# Patient Record
Sex: Female | Born: 1950
Health system: Southern US, Community
[De-identification: ages and names within clinical notes are randomized; demographics above are authoritative.]

## PROBLEM LIST (undated history)

## (undated) DIAGNOSIS — Z460 Encounter for fitting and adjustment of spectacles and contact lenses: Secondary | ICD-10-CM

## (undated) DIAGNOSIS — B029 Zoster without complications: Secondary | ICD-10-CM

## (undated) DIAGNOSIS — E785 Hyperlipidemia, unspecified: Secondary | ICD-10-CM

## (undated) DIAGNOSIS — M1990 Unspecified osteoarthritis, unspecified site: Secondary | ICD-10-CM

## (undated) DIAGNOSIS — M8580 Other specified disorders of bone density and structure, unspecified site: Secondary | ICD-10-CM

## (undated) DIAGNOSIS — M810 Age-related osteoporosis without current pathological fracture: Secondary | ICD-10-CM

## (undated) DIAGNOSIS — S22089A Unspecified fracture of T11-T12 vertebra, initial encounter for closed fracture: Secondary | ICD-10-CM

## (undated) DIAGNOSIS — J45909 Unspecified asthma, uncomplicated: Secondary | ICD-10-CM

## (undated) HISTORY — DX: Zoster without complications: B02.9

## (undated) HISTORY — DX: Unspecified fracture of t11-T12 vertebra, initial encounter for closed fracture: S22.089A

## (undated) HISTORY — DX: Age-related osteoporosis without current pathological fracture: M81.0

## (undated) HISTORY — PX: TUBAL LIGATION: SHX77

## (undated) HISTORY — DX: Other specified disorders of bone density and structure, unspecified site: M85.80

## (undated) HISTORY — PX: WISDOM TOOTH EXTRACTION: SHX21

## (undated) HISTORY — PX: COLONOSCOPY: SHX174

---
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
## 2002-05-18 ENCOUNTER — Ambulatory Visit (HOSPITAL_COMMUNITY): Admission: RE | Admit: 2002-05-18 | Discharge: 2002-05-18 | Payer: Self-pay | Admitting: Gynecology

## 2002-05-18 ENCOUNTER — Encounter: Payer: Self-pay | Admitting: Gynecology

## 2002-05-24 ENCOUNTER — Other Ambulatory Visit: Admission: RE | Admit: 2002-05-24 | Discharge: 2002-05-24 | Payer: Self-pay | Admitting: Gynecology

## 2002-09-08 ENCOUNTER — Emergency Department (HOSPITAL_COMMUNITY): Admission: EM | Admit: 2002-09-08 | Discharge: 2002-09-08 | Payer: Self-pay | Admitting: Emergency Medicine

## 2003-03-28 ENCOUNTER — Encounter: Admission: RE | Admit: 2003-03-28 | Discharge: 2003-03-28 | Payer: Self-pay | Admitting: Family Medicine

## 2003-03-28 ENCOUNTER — Encounter: Payer: Self-pay | Admitting: Family Medicine

## 2003-05-23 ENCOUNTER — Encounter: Payer: Self-pay | Admitting: Gynecology

## 2003-05-23 ENCOUNTER — Ambulatory Visit (HOSPITAL_COMMUNITY): Admission: RE | Admit: 2003-05-23 | Discharge: 2003-05-23 | Payer: Self-pay | Admitting: Gynecology

## 2003-06-20 ENCOUNTER — Other Ambulatory Visit: Admission: RE | Admit: 2003-06-20 | Discharge: 2003-06-20 | Payer: Self-pay | Admitting: Gynecology

## 2004-03-29 ENCOUNTER — Emergency Department (HOSPITAL_COMMUNITY): Admission: EM | Admit: 2004-03-29 | Discharge: 2004-03-29 | Payer: Self-pay | Admitting: Internal Medicine

## 2004-06-06 ENCOUNTER — Ambulatory Visit (HOSPITAL_COMMUNITY): Admission: RE | Admit: 2004-06-06 | Discharge: 2004-06-06 | Payer: Self-pay | Admitting: Gynecology

## 2004-07-15 ENCOUNTER — Other Ambulatory Visit: Admission: RE | Admit: 2004-07-15 | Discharge: 2004-07-15 | Payer: Self-pay | Admitting: Gynecology

## 2005-07-09 ENCOUNTER — Ambulatory Visit (HOSPITAL_COMMUNITY): Admission: RE | Admit: 2005-07-09 | Discharge: 2005-07-09 | Payer: Self-pay | Admitting: Gynecology

## 2005-07-17 ENCOUNTER — Other Ambulatory Visit: Admission: RE | Admit: 2005-07-17 | Discharge: 2005-07-17 | Payer: Self-pay | Admitting: Gynecology

## 2006-07-12 ENCOUNTER — Ambulatory Visit (HOSPITAL_COMMUNITY): Admission: RE | Admit: 2006-07-12 | Discharge: 2006-07-12 | Payer: Self-pay | Admitting: Gynecology

## 2006-10-06 ENCOUNTER — Other Ambulatory Visit: Admission: RE | Admit: 2006-10-06 | Discharge: 2006-10-06 | Payer: Self-pay | Admitting: Gynecology

## 2007-02-24 ENCOUNTER — Encounter: Admission: RE | Admit: 2007-02-24 | Discharge: 2007-02-24 | Payer: Self-pay | Admitting: Emergency Medicine

## 2007-04-18 ENCOUNTER — Ambulatory Visit (HOSPITAL_COMMUNITY): Admission: RE | Admit: 2007-04-18 | Discharge: 2007-04-18 | Payer: Self-pay | Admitting: Gastroenterology

## 2007-04-18 ENCOUNTER — Encounter (INDEPENDENT_AMBULATORY_CARE_PROVIDER_SITE_OTHER): Payer: Self-pay | Admitting: Gastroenterology

## 2007-05-22 ENCOUNTER — Emergency Department (HOSPITAL_COMMUNITY): Admission: EM | Admit: 2007-05-22 | Discharge: 2007-05-22 | Payer: Self-pay | Admitting: Emergency Medicine

## 2007-09-30 ENCOUNTER — Ambulatory Visit (HOSPITAL_COMMUNITY): Admission: RE | Admit: 2007-09-30 | Discharge: 2007-09-30 | Payer: Self-pay | Admitting: Gynecology

## 2009-03-11 ENCOUNTER — Ambulatory Visit: Payer: Self-pay | Admitting: Gastroenterology

## 2009-03-27 ENCOUNTER — Ambulatory Visit: Payer: Self-pay | Admitting: Gastroenterology

## 2009-04-25 ENCOUNTER — Ambulatory Visit: Payer: Self-pay | Admitting: Internal Medicine

## 2009-06-20 ENCOUNTER — Emergency Department (HOSPITAL_COMMUNITY): Admission: EM | Admit: 2009-06-20 | Discharge: 2009-06-20 | Payer: Self-pay | Admitting: Emergency Medicine

## 2010-05-13 ENCOUNTER — Ambulatory Visit (HOSPITAL_COMMUNITY): Admission: RE | Admit: 2010-05-13 | Discharge: 2010-05-13 | Payer: Self-pay | Admitting: Gynecology

## 2010-12-16 NOTE — Op Note (Signed)
Terri [name], [name]            ACCOUNT NO.:  [id number]   MEDICAL RECORD NO.:  [id number]          PATIENT TYPE:  AMB   LOCATION:  ENDO                         FACILITY:  [facility]   PHYSICIAN:  Anselmo Rod, M.D.  DATE OF BIRTH:  May 30, 1951   DATE OF PROCEDURE:  04/18/2007  DATE OF DISCHARGE:                               OPERATIVE REPORT   PROCEDURE PERFORMED:  Colonoscopy, with multiple random biopsies.   ENDOSCOPIST:  Anselmo Rod, M.D.   INSTRUMENT USED:  Pentax video colonoscope.   INDICATIONS FOR PROCEDURE:  A 60 year old white female undergoing a  screening colonoscopy. The patient has a history of ?diarrhea-  predominant IBS; rule out colonic polyps, masses. Random colon biopsies  planned to rule out collagenous versus lymphocytic colitis.   PREPROCEDURE PREPARATION:  Informed consent was procured from the  patient. The patient fasted for 8 hours prior to the procedure and was  prepped with 32 OsmoPrep pills the night prior to the procedure.  Risks  and benefits of the procedure, including a 10% miss rate of cancer and  polyps, were discussed with the patient as well.   PREPROCEDURE PHYSICAL EXAMINATION:  VITAL SIGNS:  The patient had stable  vital signs.  NECK:  Supple.  CHEST:  Clear to auscultation.  CARDIOVASCULAR:  S1, S2 regular.  ABDOMEN:  Soft, with normal bowel sounds.   DESCRIPTION OF THE PROCEDURE:  The patient was placed in the left  lateral decubitus position and sedated with 100 mcg of Fentanyl and 6 mg  of Versed given intravenously in slow incremental doses. Once the  patient was adequately sedated and maintained on low-flow oxygen and  continuous cardiac monitoring, the Pentax video colonoscope was advanced  from the rectum to the cecum. Multiple washes were done. The appendiceal  orifice and ileocecal valve were visualized.  The terminal ileum  appeared healthy and without lesions. There was patchy loss of vascular  markings, but the rest of  the colonic mucosa appeared healthy. No  masses, polyps, erosions, ulcerations, or diverticula were noted. Random  colon biopsies were done to rule out collagenous colitis versus  lymphocytic colitis.  Retroflexion in the rectum revealed no  abnormalities.  The patient tolerated the procedure well, without  immediate complications.   IMPRESSION:  1. Normal colonoscopy of the terminal ileum, except for patchy loss of      vascular markings. Biopsies done to rule out collagenous versus      lymphocytic colitis.  2. No masses, polyps, erosions, ulcerations, or diverticula noted.   RECOMMENDATIONS:  1. Await pathology results.  2. Avoid all nonsteroidals, including aspirin, for the next 2 weeks.  3. Repeat colonoscopy depending on pathology results.  4. Outpatient followup in the next 2 weeks for further      recommendations.      Anselmo Rod, M.D.  Electronically Signed     JNM/MEDQ  D:  04/18/2007  T:  04/18/2007  Job:  [id number]   cc:   Reuben Likes, M.D.  Fax: [phone]

## 2011-04-09 ENCOUNTER — Other Ambulatory Visit: Payer: Self-pay | Admitting: Gynecology

## 2011-04-09 DIAGNOSIS — Z1231 Encounter for screening mammogram for malignant neoplasm of breast: Secondary | ICD-10-CM

## 2011-05-15 ENCOUNTER — Ambulatory Visit (HOSPITAL_COMMUNITY)
Admission: RE | Admit: 2011-05-15 | Discharge: 2011-05-15 | Disposition: A | Discharge status: Home / Self Care | Payer: 59 | Source: Ambulatory Visit | Attending: Gynecology | Admitting: Gynecology

## 2011-05-15 DIAGNOSIS — Z1231 Encounter for screening mammogram for malignant neoplasm of breast: Secondary | ICD-10-CM | POA: Insufficient documentation

## 2012-05-03 ENCOUNTER — Other Ambulatory Visit: Payer: Self-pay | Admitting: Gynecology

## 2012-05-03 DIAGNOSIS — Z1231 Encounter for screening mammogram for malignant neoplasm of breast: Secondary | ICD-10-CM

## 2012-05-16 ENCOUNTER — Ambulatory Visit (HOSPITAL_COMMUNITY)
Admission: RE | Admit: 2012-05-16 | Discharge: 2012-05-16 | Disposition: A | Discharge status: Home / Self Care | Payer: 59 | Source: Ambulatory Visit | Attending: Gynecology | Admitting: Gynecology

## 2012-05-16 DIAGNOSIS — Z1231 Encounter for screening mammogram for malignant neoplasm of breast: Secondary | ICD-10-CM | POA: Insufficient documentation

## 2013-02-15 ENCOUNTER — Encounter: Payer: Self-pay | Admitting: Gynecology

## 2013-02-20 ENCOUNTER — Ambulatory Visit (INDEPENDENT_AMBULATORY_CARE_PROVIDER_SITE_OTHER): Payer: 59 | Admitting: Gynecology

## 2013-02-20 ENCOUNTER — Encounter: Payer: Self-pay | Admitting: Gynecology

## 2013-02-20 ENCOUNTER — Other Ambulatory Visit (HOSPITAL_COMMUNITY)
Admission: RE | Admit: 2013-02-20 | Discharge: 2013-02-20 | Disposition: A | Discharge status: Home / Self Care | Payer: 59 | Source: Ambulatory Visit | Attending: Gynecology | Admitting: Gynecology

## 2013-02-20 VITALS — BP 120/76 | Ht 62.0 in | Wt 151.0 lb

## 2013-02-20 DIAGNOSIS — N951 Menopausal and female climacteric states: Secondary | ICD-10-CM

## 2013-02-20 DIAGNOSIS — N816 Rectocele: Secondary | ICD-10-CM

## 2013-02-20 DIAGNOSIS — Z01419 Encounter for gynecological examination (general) (routine) without abnormal findings: Secondary | ICD-10-CM | POA: Insufficient documentation

## 2013-02-20 DIAGNOSIS — Z1151 Encounter for screening for human papillomavirus (HPV): Secondary | ICD-10-CM | POA: Insufficient documentation

## 2013-02-20 DIAGNOSIS — Z13220 Encounter for screening for lipoid disorders: Secondary | ICD-10-CM

## 2013-02-20 DIAGNOSIS — M8580 Other specified disorders of bone density and structure, unspecified site: Secondary | ICD-10-CM

## 2013-02-20 DIAGNOSIS — M899 Disorder of bone, unspecified: Secondary | ICD-10-CM

## 2013-02-20 NOTE — Progress Notes (Signed)
Terri [name] 26-May-1951 [id number]        62 y.o.  [id number] for annual exam.  Has not been in the office since 2008. Several issues noted below.  Past medical history,surgical history, medications, allergies, family history and social history were all reviewed and documented in the EPIC chart.  ROS:  Performed and pertinent positives and negatives are included in the history, assessment and plan .  Exam: Kim assistant Filed Vitals:   02/20/13 1507  BP: 120/76  Height: 5\' 2"  (1.575 m)  Weight: 151 lb (68.493 kg)   General appearance  Normal Skin grossly normal Head/Neck normal with no cervical or supraclavicular adenopathy thyroid normal Lungs  clear Cardiac RR, without RMG Abdominal  soft, nontender, without masses, organomegaly or hernia Breasts  examined lying and sitting without masses, retractions, discharge or axillary adenopathy. Pelvic  Ext/BUS/vagina  atrophic changes with a first to second-degree rectocele.  Cervix  normal with atrophic changes  Uterus  anteverted, normal size, shape and contour, midline and mobile nontender   Adnexa  Without masses or tenderness    Anus and perineum  normal   Rectovaginal  normal sphincter tone without palpated masses or tenderness.    Assessment/Plan:  62 y.o. [id number] female for annual exam.   1. Menopausal symptoms. Patient previously was treated with [facility] for menopausal symptoms for several years with good response. Ultimately stopped them in 2007/8. Has done well until recently when she is now had a recurrence of her hot flushes and night sweats. No weight changes hair or skin changes. Recently lost her mother and best friend. I discussed with her as although unusual to resume menopausal symptoms after they seem to have resolved. Whether the emotional stress as exacerbated this. Will check TSH now to make sure not thyroid related. Options for management include observation, OTC soy based, pharmacologic nonhormonal such as Effexor  or and HRT reviewed.  I reviewed the whole issue of HRT with her to include the WHI study with increased risk of stroke, heart attack, DVT and breast cancer. Transdermal versus oral first-pass effect benefit discussed. At this point patient wants to try OTC soy. She'll follow up with me if this does not seem effective and she wants to further discuss alternatives. 2. Rectocele. Patient has first to second-degree rectocele. Does have some pressure symptoms. This has remained stable for years. Options to include observation, pessary versus surgery such as posterior colporrhaphy discussed. She otherwise is well supported without evidence of cystocele or uterine prolapse. Patient prefers observation at this time. 3. Osteopenia. DEXA 2008 T score -2.2 no fracture due to HRT. There was a statistically significant decline of the left hip of 7.9% but improvement in the spine statistically significant. Options for treatment versus observation at that point were reviewed and she elected for observation unfortunately has not returned until now. We'll go ahead and recheck a DEXA now. We'll also check baseline vitamin D level. Increase calcium vitamin D discussed. We'll rediscuss options after DEXA report. 4. Pap smear 2008. Repeat Pap smear/HPV today. 5. Mammography 05/2012. Repeat mammogram this coming fall. SBE monthly reviewed. 6. Colonoscopy 2009. Recommended repeat a 10 year interval per her history. 7. Health maintenance. Baseline CBC comprehensive metabolic panel lipid profile TSH vitamin D and urinalysis ordered. Followup after lab work and DEXA.   Note: This document was prepared with digital dictation and possible smart phrase technology. Any transcriptional errors that result from this process are unintentional.   Dara Lords MD, 3:36 PM 02/20/2013

## 2013-02-20 NOTE — Patient Instructions (Signed)
Followup for bone density as scheduled. Try over-the-counter soy-based product. If hot flashes/night sweats continue unacceptable in followup for further treatment discussion.

## 2013-02-21 ENCOUNTER — Encounter: Payer: Self-pay | Admitting: Gynecology

## 2013-02-21 ENCOUNTER — Other Ambulatory Visit: Payer: Self-pay | Admitting: Gynecology

## 2013-02-21 DIAGNOSIS — E7800 Pure hypercholesterolemia, unspecified: Secondary | ICD-10-CM

## 2013-02-21 DIAGNOSIS — E559 Vitamin D deficiency, unspecified: Secondary | ICD-10-CM

## 2013-02-21 LAB — CBC WITH DIFFERENTIAL/PLATELET
Basophils Absolute: 0 10*3/uL (ref 0.0–0.1)
HCT: 36.1 % (ref 36.0–46.0)
Lymphocytes Relative: 39 % (ref 12–46)
Monocytes Absolute: 0.7 10*3/uL (ref 0.1–1.0)
Neutro Abs: 4 10*3/uL (ref 1.7–7.7)
RBC: 4.14 MIL/uL (ref 3.87–5.11)
RDW: 14.8 % (ref 11.5–15.5)
WBC: 8.1 10*3/uL (ref 4.0–10.5)

## 2013-02-21 LAB — VITAMIN D 25 HYDROXY (VIT D DEFICIENCY, FRACTURES): Vit D, 25-Hydroxy: 21 ng/mL — ABNORMAL LOW (ref 30–89)

## 2013-02-21 LAB — COMPREHENSIVE METABOLIC PANEL
ALT: 15 U/L (ref 0–35)
AST: 14 U/L (ref 0–37)
Albumin: 4.3 g/dL (ref 3.5–5.2)
BUN: 11 mg/dL (ref 6–23)
Calcium: 9.6 mg/dL (ref 8.4–10.5)
Chloride: 104 mEq/L (ref 96–112)
Potassium: 4.1 mEq/L (ref 3.5–5.3)

## 2013-02-21 LAB — URINALYSIS W MICROSCOPIC + REFLEX CULTURE
Casts: NONE SEEN
Crystals: NONE SEEN
Leukocytes, UA: NEGATIVE
Nitrite: NEGATIVE
Specific Gravity, Urine: 1.012 (ref 1.005–1.030)
pH: 7 (ref 5.0–8.0)

## 2013-04-03 DIAGNOSIS — M8580 Other specified disorders of bone density and structure, unspecified site: Secondary | ICD-10-CM

## 2013-04-03 HISTORY — DX: Other specified disorders of bone density and structure, unspecified site: M85.80

## 2013-04-18 ENCOUNTER — Ambulatory Visit (INDEPENDENT_AMBULATORY_CARE_PROVIDER_SITE_OTHER): Payer: 59

## 2013-04-18 ENCOUNTER — Encounter: Payer: Self-pay | Admitting: Gynecology

## 2013-04-18 DIAGNOSIS — M899 Disorder of bone, unspecified: Secondary | ICD-10-CM

## 2013-04-18 DIAGNOSIS — M8580 Other specified disorders of bone density and structure, unspecified site: Secondary | ICD-10-CM

## 2013-04-27 ENCOUNTER — Other Ambulatory Visit: Payer: 59

## 2013-05-25 ENCOUNTER — Other Ambulatory Visit: Payer: 59

## 2013-09-06 ENCOUNTER — Other Ambulatory Visit: Payer: Self-pay | Admitting: Gynecology

## 2013-09-06 DIAGNOSIS — Z1231 Encounter for screening mammogram for malignant neoplasm of breast: Secondary | ICD-10-CM

## 2013-09-07 ENCOUNTER — Ambulatory Visit (HOSPITAL_COMMUNITY)
Admission: RE | Admit: 2013-09-07 | Discharge: 2013-09-07 | Disposition: A | Discharge status: Home / Self Care | Payer: 59 | Source: Ambulatory Visit | Attending: Gynecology | Admitting: Gynecology

## 2013-09-07 DIAGNOSIS — Z1231 Encounter for screening mammogram for malignant neoplasm of breast: Secondary | ICD-10-CM | POA: Insufficient documentation

## 2013-09-13 ENCOUNTER — Encounter (HOSPITAL_BASED_OUTPATIENT_CLINIC_OR_DEPARTMENT_OTHER): Payer: Self-pay | Admitting: *Deleted

## 2013-09-13 NOTE — Progress Notes (Signed)
No labs needed

## 2013-09-14 ENCOUNTER — Other Ambulatory Visit: Payer: Self-pay | Admitting: Orthopaedic Surgery

## 2013-09-15 NOTE — H&P (Signed)
Terri [name] is an 63 y.o. female.   Chief Complaint: Right foot pain HPI: Terri [name] is complaining of right foot pain multiple months duration.  She is having pain with any type of footwear pain with ambulation.  She is walking with an altered gait.  She describes a pain as being intermittent and severe.  He must wear wide shoes.  X-rays: 3 views right foot show a dislocation of the second MTP joint.  Moderate bunion and intermetatarsal angle of 13 of the first ray.  We have discussed surgical options to reduce foot pain and improve function.  Past Medical History  Diagnosis Date  . Shingles   . Osteopenia 04/2013    t score -2.1FRAX 19%/1.4%  . Asthma     hx no meds  . Contact lens/glasses fitting     wears contacts or glasses    Past Surgical History  Procedure Laterality Date  . Tubal ligation    . Wisdom tooth extraction    . Colonoscopy      Family History  Problem Relation Age of Onset  . Heart disease Mother   . Lung cancer Mother   . Hypertension Father   . Stroke Father   . Breast cancer Maternal Aunt 52  . Breast cancer Paternal Aunt 52  . Lung cancer Maternal Grandfather   . Aneurysm Paternal Grandmother   . Lung cancer Paternal Grandfather    Social History:  reports that she has never smoked. She does not have any smokeless tobacco history on file. She reports that she drinks alcohol. She reports that she does not use illicit drugs.  Allergies:  Allergies  Allergen Reactions  . Aspirin Swelling  . Penicillins Other (See Comments)    Numbness    No prescriptions prior to admission    No results found for this or any previous visit (from the past 48 hour(s)). No results found.  Review of Systems  Musculoskeletal: Positive for joint pain.  All other systems reviewed and are negative.    Height 5\' 2"  (1.575 m), weight 68.04 kg (150 lb). Physical Exam  Constitutional: She appears well-developed.  HENT:  Head: Normocephalic.  Eyes: Pupils are  equal, round, and reactive to light.  Neck: Normal range of motion.  Cardiovascular: Normal rate.   Respiratory: Effort normal.  GI: Soft.  Musculoskeletal:  Right foot exam: Prominence of the second toe consistent with chronic dislocation at the MTP joint.  Sensation is intact.  Moderate bunion as well.  Good capillary refill.  Good sensory motor function.  Neurological: She is alert.  Skin: Skin is warm.  Psychiatric: She has a normal mood and affect.     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 Plan: We have discussed with Terri [name] a chevron osteotomy and open reduction of the MTP joint and pinning of the second toe.  Hopefully, this will reduce her foot pain and improve her function.  If reviewed the risks of anesthesia, infection.  She will need to be in a postoperative shoe for a number of weeks.  Sharilynn Cassity R 09/15/2013, 12:29 PM

## 2013-09-19 ENCOUNTER — Ambulatory Visit (HOSPITAL_BASED_OUTPATIENT_CLINIC_OR_DEPARTMENT_OTHER): Payer: 59 | Admitting: *Deleted

## 2013-09-19 ENCOUNTER — Ambulatory Visit (HOSPITAL_BASED_OUTPATIENT_CLINIC_OR_DEPARTMENT_OTHER)
Admission: RE | Admit: 2013-09-19 | Discharge: 2013-09-19 | Disposition: A | Discharge status: Home / Self Care | Payer: 59 | Source: Ambulatory Visit | Attending: Orthopaedic Surgery | Admitting: Orthopaedic Surgery

## 2013-09-19 ENCOUNTER — Encounter (HOSPITAL_BASED_OUTPATIENT_CLINIC_OR_DEPARTMENT_OTHER)
Admission: RE | Disposition: A | Discharge status: Home / Self Care | Payer: Self-pay | Source: Ambulatory Visit | Attending: Orthopaedic Surgery

## 2013-09-19 ENCOUNTER — Encounter (HOSPITAL_BASED_OUTPATIENT_CLINIC_OR_DEPARTMENT_OTHER): Payer: 59 | Admitting: *Deleted

## 2013-09-19 ENCOUNTER — Encounter (HOSPITAL_BASED_OUTPATIENT_CLINIC_OR_DEPARTMENT_OTHER): Payer: Self-pay | Admitting: *Deleted

## 2013-09-19 DIAGNOSIS — M2010 Hallux valgus (acquired), unspecified foot: Secondary | ICD-10-CM | POA: Insufficient documentation

## 2013-09-19 DIAGNOSIS — Z880 Allergy status to penicillin: Secondary | ICD-10-CM | POA: Insufficient documentation

## 2013-09-19 DIAGNOSIS — M21611 Bunion of right foot: Secondary | ICD-10-CM

## 2013-09-19 DIAGNOSIS — M24473 Recurrent dislocation, unspecified ankle: Secondary | ICD-10-CM | POA: Insufficient documentation

## 2013-09-19 DIAGNOSIS — M21619 Bunion of unspecified foot: Secondary | ICD-10-CM | POA: Insufficient documentation

## 2013-09-19 DIAGNOSIS — M2040 Other hammer toe(s) (acquired), unspecified foot: Secondary | ICD-10-CM | POA: Insufficient documentation

## 2013-09-19 DIAGNOSIS — M24476 Recurrent dislocation, unspecified foot: Secondary | ICD-10-CM

## 2013-09-19 DIAGNOSIS — Z886 Allergy status to analgesic agent status: Secondary | ICD-10-CM | POA: Insufficient documentation

## 2013-09-19 DIAGNOSIS — M2011 Hallux valgus (acquired), right foot: Secondary | ICD-10-CM

## 2013-09-19 DIAGNOSIS — R269 Unspecified abnormalities of gait and mobility: Secondary | ICD-10-CM | POA: Insufficient documentation

## 2013-09-19 HISTORY — PX: METATARSAL OSTEOTOMY WITH BUNIONECTOMY: SHX5662

## 2013-09-19 HISTORY — DX: Unspecified asthma, uncomplicated: J45.909

## 2013-09-19 HISTORY — DX: Encounter for fitting and adjustment of spectacles and contact lenses: Z46.0

## 2013-09-19 SURGERY — BUNIONECTOMY, WITH METATARSAL OSTEOTOMY
Anesthesia: Regional | Site: Foot | Laterality: Right

## 2013-09-19 MED ORDER — METOCLOPRAMIDE HCL 5 MG/ML IJ SOLN
INTRAMUSCULAR | Status: AC
  Filled 2013-09-19: qty 2

## 2013-09-19 MED ORDER — MIDAZOLAM HCL 2 MG/2ML IJ SOLN
INTRAMUSCULAR | Status: AC
  Filled 2013-09-19: qty 2

## 2013-09-19 MED ORDER — ROPIVACAINE HCL 5 MG/ML IJ SOLN
INTRAMUSCULAR | Status: DC | PRN
  Administered 2013-09-19: 20 mL via PERINEURAL

## 2013-09-19 MED ORDER — LIDOCAINE-EPINEPHRINE (PF) 1.5 %-1:200000 IJ SOLN
INTRAMUSCULAR | Status: DC | PRN
  Administered 2013-09-19: 20 mL via PERINEURAL

## 2013-09-19 MED ORDER — VANCOMYCIN HCL IN DEXTROSE 1-5 GM/200ML-% IV SOLN
INTRAVENOUS | Status: AC
  Filled 2013-09-19: qty 200

## 2013-09-19 MED ORDER — ROPIVACAINE HCL 5 MG/ML IJ SOLN: INTRAMUSCULAR | Status: DC | PRN

## 2013-09-19 MED ORDER — ONDANSETRON HCL 4 MG/2ML IJ SOLN
INTRAMUSCULAR | Status: DC | PRN
  Administered 2013-09-19: 4 mg via INTRAVENOUS

## 2013-09-19 MED ORDER — LACTATED RINGERS IV SOLN: INTRAVENOUS | Status: DC

## 2013-09-19 MED ORDER — FENTANYL CITRATE 0.05 MG/ML IJ SOLN
INTRAMUSCULAR | Status: AC
  Filled 2013-09-19: qty 4

## 2013-09-19 MED ORDER — BUPIVACAINE HCL (PF) 0.25 % IJ SOLN
INTRAMUSCULAR | Status: AC
  Filled 2013-09-19: qty 30

## 2013-09-19 MED ORDER — OXYCODONE-ACETAMINOPHEN 5-325 MG PO TABS: 1.0000 | ORAL_TABLET | Freq: Four times a day (QID) | ORAL | Status: DC | PRN

## 2013-09-19 MED ORDER — OXYCODONE HCL 5 MG/5ML PO SOLN: 5.0000 mg | Freq: Once | ORAL | Status: DC | PRN

## 2013-09-19 MED ORDER — FENTANYL CITRATE 0.05 MG/ML IJ SOLN
INTRAMUSCULAR | Status: AC
  Filled 2013-09-19: qty 2

## 2013-09-19 MED ORDER — VANCOMYCIN HCL IN DEXTROSE 1-5 GM/200ML-% IV SOLN
1000.0000 mg | INTRAVENOUS | Status: AC
  Administered 2013-09-19: 1000 mg via INTRAVENOUS

## 2013-09-19 MED ORDER — MIDAZOLAM HCL 2 MG/2ML IJ SOLN
1.0000 mg | INTRAMUSCULAR | Status: DC | PRN
  Administered 2013-09-19: 2 mg via INTRAVENOUS

## 2013-09-19 MED ORDER — CHLORHEXIDINE GLUCONATE 4 % EX LIQD: 60.0000 mL | Freq: Once | CUTANEOUS | Status: DC

## 2013-09-19 MED ORDER — DEXAMETHASONE SODIUM PHOSPHATE 10 MG/ML IJ SOLN
INTRAMUSCULAR | Status: DC | PRN
  Administered 2013-09-19: 10 mg via INTRAVENOUS

## 2013-09-19 MED ORDER — HYDROMORPHONE HCL PF 1 MG/ML IJ SOLN
0.2500 mg | INTRAMUSCULAR | Status: DC | PRN
  Administered 2013-09-19 (×2): 0.25 mg via INTRAVENOUS

## 2013-09-19 MED ORDER — OXYCODONE HCL 5 MG PO TABS: 5.0000 mg | ORAL_TABLET | Freq: Once | ORAL | Status: DC | PRN

## 2013-09-19 MED ORDER — LACTATED RINGERS IV SOLN
INTRAVENOUS | Status: DC
  Administered 2013-09-19 (×3): via INTRAVENOUS

## 2013-09-19 MED ORDER — LIDOCAINE HCL 1 % IJ SOLN
INTRAMUSCULAR | Status: DC | PRN
  Administered 2013-09-19: 2 mL via INTRADERMAL

## 2013-09-19 MED ORDER — HYDROMORPHONE HCL PF 1 MG/ML IJ SOLN
INTRAMUSCULAR | Status: AC
  Filled 2013-09-19: qty 1

## 2013-09-19 MED ORDER — LIDOCAINE HCL (CARDIAC) 20 MG/ML IV SOLN
INTRAVENOUS | Status: DC | PRN
  Administered 2013-09-19: 60 mg via INTRAVENOUS

## 2013-09-19 MED ORDER — PROPOFOL 10 MG/ML IV BOLUS
INTRAVENOUS | Status: DC | PRN
  Administered 2013-09-19: 200 mg via INTRAVENOUS

## 2013-09-19 MED ORDER — FENTANYL CITRATE 0.05 MG/ML IJ SOLN
50.0000 ug | INTRAMUSCULAR | Status: DC | PRN
  Administered 2013-09-19: 100 ug via INTRAVENOUS

## 2013-09-19 MED ORDER — METOCLOPRAMIDE HCL 5 MG/ML IJ SOLN
10.0000 mg | Freq: Once | INTRAMUSCULAR | Status: AC | PRN
  Administered 2013-09-19: 10 mg via INTRAVENOUS

## 2013-09-19 SURGICAL SUPPLY — 81 items
BANDAGE ELASTIC 3 VELCRO ST LF (GAUZE/BANDAGES/DRESSINGS) ×2 IMPLANT
BANDAGE ESMARK 6X9 LF (GAUZE/BANDAGES/DRESSINGS) ×1 IMPLANT
BLADE AVERAGE 25X9 (BLADE) ×1 IMPLANT
BLADE CRESCENTIC 13.5X.38X32 (BLADE) IMPLANT
BLADE OSC/SAG .038X5.5 CUT EDG (BLADE) ×1 IMPLANT
BLADE SURG 15 STRL LF DISP TIS (BLADE) ×2 IMPLANT
BLADE SURG 15 STRL SS (BLADE) ×4
BNDG CMPR 9X6 STRL LF SNTH (GAUZE/BANDAGES/DRESSINGS) ×1
BNDG ESMARK 6X9 LF (GAUZE/BANDAGES/DRESSINGS) ×2
BNDG GAUZE ELAST 4 BULKY (GAUZE/BANDAGES/DRESSINGS) ×2 IMPLANT
CANISTER SUCT 1200ML W/VALVE (MISCELLANEOUS) IMPLANT
CAP PIN PROTECTOR ORTHO WHT (CAP) ×1 IMPLANT
COVER TABLE BACK 60X90 (DRAPES) ×2 IMPLANT
CUFF TOURNIQUET SINGLE 18IN (TOURNIQUET CUFF) ×2 IMPLANT
CUFF TOURNIQUET SINGLE 24IN (TOURNIQUET CUFF) IMPLANT
CUFF TOURNIQUET SINGLE 34IN LL (TOURNIQUET CUFF) IMPLANT
DECANTER SPIKE VIAL GLASS SM (MISCELLANEOUS) IMPLANT
DRAPE EXTREMITY T 121X128X90 (DRAPE) ×2 IMPLANT
DRAPE OEC MINIVIEW 54X84 (DRAPES) IMPLANT
DRAPE U-SHAPE 47X51 STRL (DRAPES) ×2 IMPLANT
DRSG EMULSION OIL 3X3 NADH (GAUZE/BANDAGES/DRESSINGS) IMPLANT
DRSG TELFA 3X8 NADH (GAUZE/BANDAGES/DRESSINGS) IMPLANT
DURAPREP 26ML APPLICATOR (WOUND CARE) ×2 IMPLANT
ELECT NDL TIP 2.8 STRL (NEEDLE) ×1 IMPLANT
ELECT NEEDLE TIP 2.8 STRL (NEEDLE) ×2 IMPLANT
ELECT REM PT RETURN 9FT ADLT (ELECTROSURGICAL) ×2
ELECTRODE REM PT RTRN 9FT ADLT (ELECTROSURGICAL) ×1 IMPLANT
GAUZE SPONGE 4X4 16PLY XRAY LF (GAUZE/BANDAGES/DRESSINGS) IMPLANT
GAUZE XEROFORM 1X8 LF (GAUZE/BANDAGES/DRESSINGS) ×2 IMPLANT
GLOVE BIO SURGEON STRL SZ7 (GLOVE) ×2 IMPLANT
GLOVE BIO SURGEON STRL SZ8.5 (GLOVE) ×2 IMPLANT
GLOVE BIOGEL PI IND STRL 7.0 (GLOVE) IMPLANT
GLOVE BIOGEL PI IND STRL 8 (GLOVE) ×1 IMPLANT
GLOVE BIOGEL PI IND STRL 8.5 (GLOVE) ×1 IMPLANT
GLOVE BIOGEL PI INDICATOR 7.0 (GLOVE) ×1
GLOVE BIOGEL PI INDICATOR 8 (GLOVE) ×1
GLOVE BIOGEL PI INDICATOR 8.5 (GLOVE) ×1
GLOVE ECLIPSE 6.5 STRL STRAW (GLOVE) ×1 IMPLANT
GLOVE SS BIOGEL STRL SZ 8 (GLOVE) ×1 IMPLANT
GLOVE SUPERSENSE BIOGEL SZ 8 (GLOVE) ×1
GOWN STRL REUS W/ TWL LRG LVL3 (GOWN DISPOSABLE) ×1 IMPLANT
GOWN STRL REUS W/ TWL XL LVL3 (GOWN DISPOSABLE) IMPLANT
GOWN STRL REUS W/TWL LRG LVL3 (GOWN DISPOSABLE) ×2
GOWN STRL REUS W/TWL XL LVL3 (GOWN DISPOSABLE) ×6
K-WIRE .045X4 (WIRE) ×1 IMPLANT
K-WIRE .062X4 (WIRE) IMPLANT
KIT 1-PIN ORTHOSORB 1.3X40 (Pin) ×1 IMPLANT
NDL HYPO 25X1 1.5 SAFETY (NEEDLE) IMPLANT
NEEDLE HYPO 22GX1.5 SAFETY (NEEDLE) IMPLANT
NEEDLE HYPO 25X1 1.5 SAFETY (NEEDLE) IMPLANT
NS IRRIG 1000ML POUR BTL (IV SOLUTION) ×2 IMPLANT
PACK BASIN DAY SURGERY FS (CUSTOM PROCEDURE TRAY) ×2 IMPLANT
PAD ABD 8X10 STRL (GAUZE/BANDAGES/DRESSINGS) IMPLANT
PAD DRESSING TELFA 3X8 NADH (GAUZE/BANDAGES/DRESSINGS) IMPLANT
PADDING CAST ABS 3INX4YD NS (CAST SUPPLIES)
PADDING CAST ABS 4INX4YD NS (CAST SUPPLIES) ×1
PADDING CAST ABS COTTON 3X4 (CAST SUPPLIES) IMPLANT
PADDING CAST ABS COTTON 4X4 ST (CAST SUPPLIES) ×1 IMPLANT
PENCIL BUTTON HOLSTER BLD 10FT (ELECTRODE) ×2 IMPLANT
SHEET MEDIUM DRAPE 40X70 STRL (DRAPES) ×2 IMPLANT
SLEEVE SCD COMPRESS KNEE MED (MISCELLANEOUS) ×1 IMPLANT
SPONGE GAUZE 4X4 12PLY (GAUZE/BANDAGES/DRESSINGS) ×2 IMPLANT
STOCKINETTE 6  STRL (DRAPES) ×1
STOCKINETTE 6 STRL (DRAPES) ×1 IMPLANT
SUCTION FRAZIER TIP 10 FR DISP (SUCTIONS) IMPLANT
SUT BONE WAX W31G (SUTURE) IMPLANT
SUT ETHILON 3 0 PS 1 (SUTURE) ×4 IMPLANT
SUT ETHILON 4 0 PS 2 18 (SUTURE) IMPLANT
SUT ETHILON 5 0 PS 2 18 (SUTURE) IMPLANT
SUT VIC AB 0 CT1 27 (SUTURE) ×2
SUT VIC AB 0 CT1 27XBRD ANBCTR (SUTURE) IMPLANT
SUT VIC AB 2-0 SH 27 (SUTURE)
SUT VIC AB 2-0 SH 27XBRD (SUTURE) IMPLANT
SUT VIC AB 4-0 P-3 18XBRD (SUTURE) IMPLANT
SUT VIC AB 4-0 P3 18 (SUTURE)
SYR BULB 3OZ (MISCELLANEOUS) ×2 IMPLANT
SYR CONTROL 10ML LL (SYRINGE) IMPLANT
TOWEL OR 17X24 6PK STRL BLUE (TOWEL DISPOSABLE) ×4 IMPLANT
TOWEL OR NON WOVEN STRL DISP B (DISPOSABLE) ×2 IMPLANT
TUBE CONNECTING 20X1/4 (TUBING) IMPLANT
UNDERPAD 30X30 INCONTINENT (UNDERPADS AND DIAPERS) ×2 IMPLANT

## 2013-09-19 NOTE — Op Note (Signed)
#  [id number] 

## 2013-09-19 NOTE — Transfer of Care (Signed)
Immediate Anesthesia Transfer of Care Note  Patient: Terri [name]  Procedure(s) Performed: Procedure(s): RIGHT FOOT CHEVRON OSTEOTOMY AND 2ND TOE RESECTION  ARTHOPLASTY with pin fixation (Right)  Patient Location: PACU  Anesthesia Type:GA combined with regional for post-op pain  Level of Consciousness: awake, alert  and oriented  Airway & Oxygen Therapy: Patient Spontanous Breathing and Patient connected to face mask oxygen  Post-op Assessment: Report given to PACU RN, Post -op Vital signs reviewed and stable and Patient moving all extremities  Post vital signs: Reviewed and stable  Complications: No apparent anesthesia complications

## 2013-09-19 NOTE — Anesthesia Postprocedure Evaluation (Signed)
Anesthesia Post Note  Patient: Terri [name]  Procedure(s) Performed: Procedure(s) (LRB): RIGHT FOOT CHEVRON OSTEOTOMY AND 2ND TOE RESECTION  ARTHOPLASTY with pin fixation (Right)  Anesthesia type: General  Patient location: PACU  Post pain: Pain level controlled  Post assessment: Patient's Cardiovascular Status Stable  Last Vitals:  Filed Vitals:   09/19/13 1300  BP: 137/62  Pulse: 69  Temp:   Resp: 19    Post vital signs: Reviewed and stable  Level of consciousness: alert  Complications: No apparent anesthesia complications

## 2013-09-19 NOTE — Progress Notes (Signed)
Assisted Dr. Frederick with right, ultrasound guided, popliteal/saphenous block. Side rails up, monitors on throughout procedure. See vital signs in flow sheet. Tolerated Procedure well. 

## 2013-09-19 NOTE — Anesthesia Procedure Notes (Addendum)
Anesthesia Regional Block:  Popliteal block  Pre-Anesthetic Checklist: ,, timeout performed, Correct Patient, Correct Site, Correct Laterality, Correct Procedure, Correct Position, site marked, Risks and benefits discussed,  Surgical consent,  Pre-op evaluation,  At surgeon's request and post-op pain management  Laterality: Right  Prep: chloraprep       Needles:   Needle Type: Other     Needle Length: 9cm 9 cm Needle Gauge: 21 and 21 G    Additional Needles:  Procedures: ultrasound guided (picture in chart) Popliteal block Narrative:  Start time: 09/19/2013 10:40 AM End time: 09/19/2013 10:52 AM Injection made incrementally with aspirations every 5 mL.  Performed by: Personally  Anesthesiologist: Nada Libman, MD  Additional Notes: Ultrasound guidance used to: id relevant anatomy, confirm needle position, local anesthetic spread, avoidance of vascular puncture. Picture saved. No complications. Block performed personally by Jessy Oto. Albertina Parr, MD  .     Procedure Name: LMA Insertion Date/Time: 09/19/2013 11:09 AM Performed by: Hessie Dibble Pre-anesthesia Checklist: Patient identified, Emergency Drugs available, Suction available and Patient being monitored Patient Re-evaluated:Patient Re-evaluated prior to inductionOxygen Delivery Method: Circle System Utilized Preoxygenation: Pre-oxygenation with 100% oxygen Intubation Type: IV induction Ventilation: Mask ventilation without difficulty LMA: LMA inserted LMA Size: 4.0 Number of attempts: 1 Airway Equipment and Method: bite block Placement Confirmation: positive ETCO2 and breath sounds checked- equal and bilateral Tube secured with: Tape Dental Injury: Teeth and Oropharynx as per pre-operative assessment

## 2013-09-19 NOTE — Discharge Instructions (Signed)
Discharge Instructions After Orthopedic Procedures:  *You may feel tired and weak following your procedure. It is recommended that you limit physical activity for the next 24 hours and rest at home for the remainder of today and tomorrow. *No strenuous activity should be started without your doctor's permission.  Elevate the extremity that you had surgery on to a level above your heart. This should continue for 48 hours or as instructed by your doctor.  If you had hand, arm or shoulder surgery you should move your fingers frequently unless otherwise instructed by your doctor.  If you had foot, knee or leg surgery you should wiggle your toes frequently unless otherwise instructed by your doctor.  Follow your doctor's exact instructions for activity at home. Use your home equipment as instructed. (Crutches, hard shoes, slings etc.)  Limit your activity as instructed by your doctor.  Report to your doctor should any of the following occur: 1. Extreme swelling of your fingers or toes. 2. Inability to wiggle your fingers or toes. 3. Coldness, pale or bluish color in your fingers or toes. 4. Loss of sensation, numbness or tingling of your fingers or toes. 5. Unusual smell or odor from under your dressing or cast. 6. Excessive bleeding or drainage from the surgical site. 7. Pain not relieved by medication your doctor has prescribed for you. 8. Cast or dressing too tight (do not get your dressing or cast wet or put anything under          your dressing or cast.)  *Do not change your dressing unless instructed by your doctor or discharge nurse. Then follow exact instructions.  *Follow labeled instructions for any medications that your doctor may have prescribed for you. *Should any questions or complications develop following your procedure, PLEASE CONTACT YOUR DOCTOR.   Regional Anesthesia Blocks  1. Numbness or the inability to move the "blocked" extremity may last from 3-48 hours after  placement. The length of time depends on the medication injected and your individual response to the medication. If the numbness is not going away after 48 hours, call your surgeon.  2. The extremity that is blocked will need to be protected until the numbness is gone and the  Strength has returned. Because you cannot feel it, you will need to take extra care to avoid injury. Because it may be weak, you may have difficulty moving it or using it. You may not know what position it is in without looking at it while the block is in effect.  3. For blocks in the legs and feet, returning to weight bearing and walking needs to be done carefully. You will need to wait until the numbness is entirely gone and the strength has returned. You should be able to move your leg and foot normally before you try and bear weight or walk. You will need someone to be with you when you first try to ensure you do not fall and possibly risk injury.  4. Bruising and tenderness at the needle site are common side effects and will resolve in a few days.  5. Persistent numbness or new problems with movement should be communicated to the surgeon or the [facility] [phone] [place] [phone]).   Post Anesthesia Home Care Instructions  Activity: Get plenty of rest for the remainder of the day. A responsible adult should stay with you for 24 hours following the procedure.  For the next 24 hours, DO NOT: -Drive a car Film/video editor -  Drink alcoholic beverages -Take any medication unless instructed by your physician -Make any legal decisions or sign important papers.  Meals: Start with liquid foods such as gelatin or soup. Progress to regular foods as tolerated. Avoid greasy, spicy, heavy foods. If nausea and/or vomiting occur, drink only clear liquids until the nausea and/or vomiting subsides. Call your physician if vomiting continues.  Special Instructions/Symptoms: Your throat may  feel dry or sore from the anesthesia or the breathing tube placed in your throat during surgery. If this causes discomfort, gargle with warm salt water. The discomfort should disappear within 24 hours.

## 2013-09-19 NOTE — Anesthesia Preprocedure Evaluation (Signed)
Anesthesia Evaluation  Patient identified by MRN, date of birth, ID band Patient awake    Reviewed: Allergy & Precautions, H&P , NPO status , Patient's Chart, lab work & pertinent test results, reviewed documented beta blocker date and time   Airway Mallampati: II TM Distance: >3 FB Neck ROM: full    Dental   Pulmonary asthma ,  breath sounds clear to auscultation        Cardiovascular negative cardio ROS  Rhythm:regular     Neuro/Psych negative neurological ROS  negative psych ROS   GI/Hepatic negative GI ROS, Neg liver ROS,   Endo/Other  negative endocrine ROS  Renal/GU negative Renal ROS  negative genitourinary   Musculoskeletal   Abdominal   Peds  Hematology negative hematology ROS (+)   Anesthesia Other Findings See surgeon's H&P   Reproductive/Obstetrics negative OB ROS                           Anesthesia Physical Anesthesia Plan  ASA: II  Anesthesia Plan: General   Post-op Pain Management:    Induction: Intravenous  Airway Management Planned: LMA  Additional Equipment:   Intra-op Plan:   Post-operative Plan:   Informed Consent: I have reviewed the patients History and Physical, chart, labs and discussed the procedure including the risks, benefits and alternatives for the proposed anesthesia with the patient or authorized representative who has indicated his/her understanding and acceptance.   Dental Advisory Given  Plan Discussed with: CRNA and Surgeon  Anesthesia Plan Comments:         Anesthesia Quick Evaluation  

## 2013-09-19 NOTE — Op Note (Deleted)
Terri [name], Terri [name]            ACCOUNT NO.:  [id number]  MEDICAL RECORD NO.:  [id number]  LOCATION:  [facility]                          FACILITY:  [facility]  PHYSICIAN:  Monico Blitz. Jeancarlos Marchena, M.D.DATE OF BIRTH:  April 01, 1951  DATE OF PROCEDURE:  09/19/2013 DATE OF DISCHARGE:  09/07/2013                              OPERATIVE REPORT   PREOPERATIVE DIAGNOSES: 1. Right foot hallux valgus. 2. Right foot second hammertoe. 3. Right foot chronic second MTP dislocation.  POSTOPERATIVE DIAGNOSES: 1. Right foot hallux valgus. 2. Right foot second hammertoe. 3. Right foot chronic second MTP dislocation.  PROCEDURES: 1. Right foot chevron osteotomy. 2. Right foot hammertoe resection arthroplasty. 3. Right second MTP resection arthroplasty.  ANESTHESIA:  General and block.  ATTENDING SURGEON:  Monico Blitz. Rhona Raider, M.D.  ASSISTANT:  Roselee Nova, PA  INDICATION FOR PROCEDURE:  The patient is a 63 year old woman, we have followed for many years with a chronically dislocated second MTP joint and impressive hallux valgus deformity.  Her intermetatarsal angle was about 14 degrees and we felt this was still amenable to chevron osteotomy.  We also wanted to reduce her painful second MTP joint, and as this had been dislocated for 5 or 6 years at this point, I thought our best option was going to be a resection arthroplasty.  She was also noted to have a hammertoe with a fixed deformity of the PIP joint of the second toe and we decided to address that as well.  Informed operative consent was obtained after discussion of possible complications including reaction to anesthesia, infection, neurovascular injury.  I stressed that her second toe would be a bit floppy as a result of these procedures.  SUMMARY OF FINDINGS AND PROCEDURE:  Under general anesthesia and a block through 3 separate incisions, we performed 3 separate procedures.  We performed a chevron osteotomy utilizing fluoroscopy throughout  this portion of the case.  I also placed an OrthoSorb pin to secure this osteotomy site.  At the second toe, I performed a resection arthroplasty of the distal portion of the proximal phalanx to straighten out the PIP joint and then resected a portion of the base of the proximal phalanx to reduce the MTP joint which was chronically dislocated and stuck in a superior position.  We then pinned this across the PIP and MTP joints and repaired soft tissues above the joint.  She was discharged home same day.  DESCRIPTION OF PROCEDURE:  The patient was taken to operating suite where general anesthetic was applied without difficulty.  She was also given a block in the pre-anesthesia area.  She was positioned supine and prepped and draped in normal sterile fashion.  After the administration of preop IV Kefzol, the right leg was elevated, exsanguinated, and a tourniquet inflated about the calf.  I made an elliptical incision on the dorsal aspect of the second PIP joint with dissection down to the prominent proximal phalanx head.  I removed several millimeters of this with a saw.  This subsequently allowed her toe to straighten at that joint.  I then made a longitudinal incision over the dislocated second MTP joint.  I dissected down to the joint.  At this point, the base of  the proximal phalanx was significantly deformed consistent with this being dislocated for many years.  I resected about 5 mm of this bone perpendicular to the shaft of the bone.  I was then able to reduce the MTP joint.  This actually stayed in place without any pressure.  I subsequently placed the pin longitudinally through this toe into the distal portion of the second metatarsal, holding it in appropriate position.  I performed a soft tissue repair utilizing the extensor tendon and soft tissues to repair over the dorsal aspect of the joint. This was done with Vicryl.  I then used nylon to reapproximate the skin at both of  these second toe incisions.  I then made an incision over the dorsomedial aspect of the first MTP joint with dissection down the capsule.  I made a Y shaped incision and capsule based distally and exposed the large bunion.  I then removed the bunion with an oscillating saw and performed a chevron cut.  We lateralized the distal portion of the toe about 5 mm.  This was then pinned with an OrthoSorb pin under fluoroscopic guidance.  I then removed the resultant prominence with an oscillating saw.  This wound was thoroughly irrigated followed by reapproximation of capsule with 0 Vicryl and subcutaneous tissues with 2- 0 undyed Vicryl.  Skin was closed with nylon.  Again, we checked a fluoroscopic view towards the end, which showed [place] a nice correction.  I read these views myself.  The tourniquet was deflated during closure and the toes became pink and warm immediately.  We applied some Adaptic and a dry gauze dressing with loose Ace wrap.  Estimated blood loss and fluids, as well as accurate tourniquet time can obtained from anesthesia records.  DISPOSITION:  The patient was extubated in the operating room and taken to recovery room in stable condition.  She was to go home same-day and follow up in the office closely.  I will contact her by phone tonight.     Monico Blitz Rhona Raider, M.D.     PGD/MEDQ  D:  09/19/2013  T:  09/19/2013  Job:  [id number]

## 2013-09-19 NOTE — Op Note (Signed)
Terri [name], Terri [name]            ACCOUNT NO.:  [id number]  MEDICAL RECORD NO.:  [id number]  LOCATION:                                 FACILITY:  PHYSICIAN:  Monico Blitz. Tarnesha Ulloa, M.D.DATE OF BIRTH:  1951-03-03  DATE OF PROCEDURE:  09/19/2013 DATE OF DISCHARGE:  09/19/2013                              OPERATIVE REPORT   PREOPERATIVE DIAGNOSES: 1. Right foot hallux valgus. 2. Right foot second hammertoe. 3. Right foot chronic second MTP dislocation.  POSTOPERATIVE DIAGNOSES: 1. Right foot hallux valgus. 2. Right foot second hammertoe. 3. Right foot chronic second MTP dislocation.  PROCEDURES: 1. Right foot chevron osteotomy. 2. Right foot hammertoe resection arthroplasty. 3. Right second MTP resection arthroplasty.  ANESTHESIA:  General and block.  ATTENDING SURGEON:  Monico Blitz. Rhona Raider, M.D.  ASSISTANT:  Roselee Nova, PA  INDICATION FOR PROCEDURE:  The patient is a 63 year old woman, we have followed for many years with a chronically dislocated second MTP joint and impressive hallux valgus deformity.  Her intermetatarsal angle was about 14 degrees and we felt this was still amenable to chevron osteotomy.  We also wanted to reduce her painful second MTP joint, and as this had been dislocated for 5 or 6 years at this point, I thought our best option was going to be a resection arthroplasty.  She was also noted to have a hammertoe with a fixed deformity of the PIP joint of the second toe and we decided to address that as well.  Informed operative consent was obtained after discussion of possible complications including reaction to anesthesia, infection, neurovascular injury.  I stressed that her second toe would be a bit floppy as a result of these procedures.  SUMMARY OF FINDINGS AND PROCEDURE:  Under general anesthesia and a block through 3 separate incisions, we performed 3 separate procedures.  We performed a chevron osteotomy utilizing fluoroscopy throughout  this portion of the case.  I also placed an OrthoSorb pin to secure this osteotomy site.  At the second toe, I performed a resection arthroplasty of the distal portion of the proximal phalanx to straighten out the PIP joint and then resected a portion of the base of the proximal phalanx to reduce the MTP joint which was chronically dislocated and stuck in a superior position.  We then pinned this across the PIP and MTP joints and repaired soft tissues above the joint.  She was discharged home same day.  DESCRIPTION OF PROCEDURE:  The patient was taken to operating suite where general anesthetic was applied without difficulty.  She was also given a block in the pre-anesthesia area.  She was positioned supine and prepped and draped in normal sterile fashion.  After the administration of preop IV Kefzol, the right leg was elevated, exsanguinated, and a tourniquet inflated about the calf.  I made an elliptical incision on the dorsal aspect of the second PIP joint with dissection down to the prominent proximal phalanx head.  I removed several millimeters of this with a saw.  This subsequently allowed her toe to straighten at that joint.  I then made a longitudinal incision over the dislocated second MTP joint.  I dissected down to the joint.  At this point,  the base of the proximal phalanx was significantly deformed consistent with this being dislocated for many years.  I resected about 5 mm of this bone perpendicular to the shaft of the bone.  I was then able to reduce the MTP joint.  This actually stayed in place without any pressure.  I subsequently placed the pin longitudinally through this toe into the distal portion of the second metatarsal, holding it in appropriate position.  I performed a soft tissue repair utilizing the extensor tendon and soft tissues to repair over the dorsal aspect of the joint. This was done with Vicryl.  I then used nylon to reapproximate the skin at both of  these second toe incisions.  I then made an incision over the dorsomedial aspect of the first MTP joint with dissection down the capsule.  I made a Y shaped incision and capsule based distally and exposed the large bunion.  I then removed the bunion with an oscillating saw and performed a chevron cut.  We lateralized the distal portion of the toe about 5 mm.  This was then pinned with an OrthoSorb pin under fluoroscopic guidance.  I then removed the resultant prominence with an oscillating saw.  This wound was thoroughly irrigated followed by reapproximation of capsule with 0 Vicryl and subcutaneous tissues with 2- 0 undyed Vicryl.  Skin was closed with nylon.  Again, we checked a fluoroscopic view towards the end, which showed [place] a nice correction.  I read these views myself.  The tourniquet was deflated during closure and the toes became pink and warm immediately.  We applied some Adaptic and a dry gauze dressing with loose Ace wrap.  Estimated blood loss and fluids, as well as accurate tourniquet time can obtained from anesthesia records.  DISPOSITION:  The patient was extubated in the operating room and taken to recovery room in stable condition.  She was to go home same-day and follow up in the office closely.  I will contact her by phone tonight.     Monico Blitz Rhona Raider, M.D.     PGD/MEDQ  D:  09/19/2013  T:  09/19/2013  Job:  [id number]

## 2013-09-19 NOTE — Interval H&P Note (Signed)
History and Physical Interval Note:  09/19/2013 10:30 AM  Terri [name]  has presented today for surgery, with the diagnosis of RIGHT FOOT [place]   The various methods of treatment have been discussed with the patient and family. After consideration of risks, benefits and other options for treatment, the patient has consented to  Procedure(s): RIGHT FOOT CHEVRON OSTEOTOMY AND 2ND TOE RESECTION  ARTHOPLASTY  (Right) as a surgical intervention .  The patient's history has been reviewed, patient examined, no change in status, stable for surgery.  I have reviewed the patient's chart and labs.  Questions were answered to the patient's satisfaction.     Terri [name] G

## 2014-06-04 ENCOUNTER — Encounter (HOSPITAL_BASED_OUTPATIENT_CLINIC_OR_DEPARTMENT_OTHER): Payer: Self-pay | Admitting: *Deleted

## 2014-09-04 ENCOUNTER — Encounter: Payer: Self-pay | Admitting: Gynecology

## 2014-09-04 ENCOUNTER — Ambulatory Visit (INDEPENDENT_AMBULATORY_CARE_PROVIDER_SITE_OTHER): Payer: 59 | Admitting: Gynecology

## 2014-09-04 VITALS — BP 120/76 | Ht 62.0 in | Wt 158.0 lb

## 2014-09-04 DIAGNOSIS — M8580 Other specified disorders of bone density and structure, unspecified site: Secondary | ICD-10-CM

## 2014-09-04 DIAGNOSIS — Z01419 Encounter for gynecological examination (general) (routine) without abnormal findings: Secondary | ICD-10-CM

## 2014-09-04 LAB — CBC WITH DIFFERENTIAL/PLATELET
Basophils Absolute: 0 10*3/uL (ref 0.0–0.1)
Basophils Relative: 0 % (ref 0–1)
Eosinophils Absolute: 0.3 10*3/uL (ref 0.0–0.7)
Eosinophils Relative: 3 % (ref 0–5)
HEMATOCRIT: 38.2 % (ref 36.0–46.0)
Hemoglobin: 12.8 g/dL (ref 12.0–15.0)
Lymphocytes Relative: 31 % (ref 12–46)
Lymphs Abs: 2.7 10*3/uL (ref 0.7–4.0)
MCH: 29.9 pg (ref 26.0–34.0)
MCHC: 33.5 g/dL (ref 30.0–36.0)
MCV: 89.3 fL (ref 78.0–100.0)
MONO ABS: 0.8 10*3/uL (ref 0.1–1.0)
MONOS PCT: 9 % (ref 3–12)
MPV: 10.4 fL (ref 8.6–12.4)
NEUTROS ABS: 4.9 10*3/uL (ref 1.7–7.7)
Neutrophils Relative %: 57 % (ref 43–77)
Platelets: 277 10*3/uL (ref 150–400)
RBC: 4.28 MIL/uL (ref 3.87–5.11)
RDW: 14.1 % (ref 11.5–15.5)
WBC: 8.6 10*3/uL (ref 4.0–10.5)

## 2014-09-04 LAB — COMPREHENSIVE METABOLIC PANEL
ALBUMIN: 4 g/dL (ref 3.5–5.2)
ALK PHOS: 100 U/L (ref 39–117)
ALT: 13 U/L (ref 0–35)
AST: 14 U/L (ref 0–37)
BUN: 16 mg/dL (ref 6–23)
CO2: 28 meq/L (ref 19–32)
Calcium: 9.3 mg/dL (ref 8.4–10.5)
Chloride: 103 mEq/L (ref 96–112)
Creat: 0.65 mg/dL (ref 0.50–1.10)
Glucose, Bld: 82 mg/dL (ref 70–99)
Potassium: 4 mEq/L (ref 3.5–5.3)
Sodium: 138 mEq/L (ref 135–145)
Total Bilirubin: 0.3 mg/dL (ref 0.2–1.2)
Total Protein: 7.1 g/dL (ref 6.0–8.3)

## 2014-09-04 LAB — LIPID PANEL
CHOL/HDL RATIO: 3.4 ratio
CHOLESTEROL: 238 mg/dL — AB (ref 0–200)
HDL: 69 mg/dL (ref >39)
LDL Cholesterol: 140 mg/dL — ABNORMAL HIGH (ref 0–99)
Triglycerides: 146 mg/dL (ref <150)
VLDL: 29 mg/dL (ref 0–40)

## 2014-09-04 NOTE — Patient Instructions (Signed)
You may obtain a copy of any labs that were done today by logging onto MyChart as outlined in the instructions provided with your AVS (after visit summary). The office will not call with normal lab results but certainly if there are any significant abnormalities then we will contact you.   Health Maintenance, Female A healthy lifestyle and preventative care can promote health and wellness.  Maintain regular health, dental, and eye exams.  Eat a healthy diet. Foods like vegetables, fruits, whole grains, low-fat dairy products, and lean protein foods contain the nutrients you need without too many calories. Decrease your intake of foods high in solid fats, added sugars, and salt. Get information about a proper diet from your caregiver, if necessary.  Regular physical exercise is one of the most important things you can do for your health. Most adults should get at least 150 minutes of moderate-intensity exercise (any activity that increases your heart rate and causes you to sweat) each week. In addition, most adults need muscle-strengthening exercises on 2 or more days a week.   Maintain a healthy weight. The body mass index (BMI) is a screening tool to identify possible weight problems. It provides an estimate of body fat based on height and weight. Your caregiver can help determine your BMI, and can help you achieve or maintain a healthy weight. For adults 20 years and older:  A BMI below 18.5 is considered underweight.  A BMI of 18.5 to 24.9 is normal.  A BMI of 25 to 29.9 is considered overweight.  A BMI of 30 and above is considered obese.  Maintain normal blood lipids and cholesterol by exercising and minimizing your intake of saturated fat. Eat a balanced diet with plenty of fruits and vegetables. Blood tests for lipids and cholesterol should begin at age 38 and be repeated every 5 years. If your lipid or cholesterol levels are high, you are over 50, or you are a high risk for heart  disease, you may need your cholesterol levels checked more frequently.Ongoing high lipid and cholesterol levels should be treated with medicines if diet and exercise are not effective.  If you smoke, find out from your caregiver how to quit. If you do not use tobacco, do not start.  Lung cancer screening is recommended for adults aged 15 80 years who are at high risk for developing lung cancer because of a history of smoking. Yearly low-dose computed tomography (CT) is recommended for people who have at least a 30-pack-year history of smoking and are a current smoker or have quit within the past 15 years. A pack year of smoking is smoking an average of 1 pack of cigarettes a day for 1 year (for example: 1 pack a day for 30 years or 2 packs a day for 15 years). Yearly screening should continue until the smoker has stopped smoking for at least 15 years. Yearly screening should also be stopped for people who develop a health problem that would prevent them from having lung cancer treatment.  If you are pregnant, do not drink alcohol. If you are breastfeeding, be very cautious about drinking alcohol. If you are not pregnant and choose to drink alcohol, do not exceed 1 drink per day. One drink is considered to be 12 ounces (355 mL) of beer, 5 ounces (148 mL) of wine, or 1.5 ounces (44 mL) of liquor.  Avoid use of street drugs. Do not share needles with anyone. Ask for help if you need support or instructions about stopping  the use of drugs.  High blood pressure causes heart disease and increases the risk of stroke. Blood pressure should be checked at least every 1 to 2 years. Ongoing high blood pressure should be treated with medicines, if weight loss and exercise are not effective.  If you are 59 to 64 years old, ask your caregiver if you should take aspirin to prevent strokes.  Diabetes screening involves taking a blood sample to check your fasting blood sugar level. This should be done once every 3  years, after age 91, if you are within normal weight and without risk factors for diabetes. Testing should be considered at a younger age or be carried out more frequently if you are overweight and have at least 1 risk factor for diabetes.  Breast cancer screening is essential preventative care for women. You should practice "breast self-awareness." This means understanding the normal appearance and feel of your breasts and may include breast self-examination. Any changes detected, no matter how small, should be reported to a caregiver. Women in their 66s and 30s should have a clinical breast exam (CBE) by a caregiver as part of a regular health exam every 1 to 3 years. After age 101, women should have a CBE every year. Starting at age 100, women should consider having a mammogram (breast X-ray) every year. Women who have a family history of breast cancer should talk to their caregiver about genetic screening. Women at a high risk of breast cancer should talk to their caregiver about having an MRI and a mammogram every year.  Breast cancer gene (BRCA)-related cancer risk assessment is recommended for women who have family members with BRCA-related cancers. BRCA-related cancers include breast, ovarian, tubal, and peritoneal cancers. Having family members with these cancers may be associated with an increased risk for harmful changes (mutations) in the breast cancer genes BRCA1 and BRCA2. Results of the assessment will determine the need for genetic counseling and BRCA1 and BRCA2 testing.  The Pap test is a screening test for cervical cancer. Women should have a Pap test starting at age 57. Between ages 25 and 35, Pap tests should be repeated every 2 years. Beginning at age 37, you should have a Pap test every 3 years as long as the past 3 Pap tests have been normal. If you had a hysterectomy for a problem that was not cancer or a condition that could lead to cancer, then you no longer need Pap tests. If you are  between ages 50 and 76, and you have had normal Pap tests going back 10 years, you no longer need Pap tests. If you have had past treatment for cervical cancer or a condition that could lead to cancer, you need Pap tests and screening for cancer for at least 20 years after your treatment. If Pap tests have been discontinued, risk factors (such as a new sexual partner) need to be reassessed to determine if screening should be resumed. Some women have medical problems that increase the chance of getting cervical cancer. In these cases, your caregiver may recommend more frequent screening and Pap tests.  The human papillomavirus (HPV) test is an additional test that may be used for cervical cancer screening. The HPV test looks for the virus that can cause the cell changes on the cervix. The cells collected during the Pap test can be tested for HPV. The HPV test could be used to screen women aged 44 years and older, and should be used in women of any age  who have unclear Pap test results. After the age of 55, women should have HPV testing at the same frequency as a Pap test.  Colorectal cancer can be detected and often prevented. Most routine colorectal cancer screening begins at the age of 44 and continues through age 20. However, your caregiver may recommend screening at an earlier age if you have risk factors for colon cancer. On a yearly basis, your caregiver may provide home test kits to check for hidden blood in the stool. Use of a small camera at the end of a tube, to directly examine the colon (sigmoidoscopy or colonoscopy), can detect the earliest forms of colorectal cancer. Talk to your caregiver about this at age 86, when routine screening begins. Direct examination of the colon should be repeated every 5 to 10 years through age 13, unless early forms of pre-cancerous polyps or small growths are found.  Hepatitis C blood testing is recommended for all people born from [birth year] through 1964 and any  individual with known risks for hepatitis C.  Practice safe sex. Use condoms and avoid high-risk sexual practices to reduce the spread of sexually transmitted infections (STIs). Sexually active women aged 36 and younger should be checked for Chlamydia, which is a common sexually transmitted infection. Older women with new or multiple partners should also be tested for Chlamydia. Testing for other STIs is recommended if you are sexually active and at increased risk.  Osteoporosis is a disease in which the bones lose minerals and strength with aging. This can result in serious bone fractures. The risk of osteoporosis can be identified using a bone density scan. Women ages 20 and over and women at risk for fractures or osteoporosis should discuss screening with their caregivers. Ask your caregiver whether you should be taking a calcium supplement or vitamin D to reduce the rate of osteoporosis.  Menopause can be associated with physical symptoms and risks. Hormone replacement therapy is available to decrease symptoms and risks. You should talk to your caregiver about whether hormone replacement therapy is right for you.  Use sunscreen. Apply sunscreen liberally and repeatedly throughout the day. You should seek shade when your shadow is shorter than you. Protect yourself by wearing long sleeves, pants, a wide-brimmed hat, and sunglasses year round, whenever you are outdoors.  Notify your caregiver of new moles or changes in moles, especially if there is a change in shape or color. Also notify your caregiver if a mole is larger than the size of a pencil eraser.  Stay current with your immunizations. Document Released: 02/02/2011 Document Revised: 11/14/2012 Document Reviewed: 02/02/2011 [facility] Patient Information 2014 [employer].

## 2014-09-04 NOTE — Progress Notes (Signed)
Terri [name] February 27, 1951 [id number]        64 y.o.  [id number] for annual exam.  Several issues noted below.  Past medical history,surgical history, problem list, medications, allergies, family history and social history were all reviewed and documented as reviewed in the EPIC chart.  ROS:  Performed with pertinent positives and negatives included in the history, assessment and plan.   Additional significant findings :  none   Exam: Terri [name] Vitals:   09/04/14 1357  BP: 120/76  Height: 5\' 2"  (1.575 m)  Weight: 158 lb (71.668 kg)   General appearance:  Normal affect, orientation and appearance. Skin: Grossly normal HEENT: Without gross lesions.  No cervical or supraclavicular adenopathy. Thyroid normal.  Lungs:  Clear without wheezing, rales or rhonchi Cardiac: RR, without RMG Abdominal:  Soft, nontender, without masses, guarding, rebound, organomegaly or hernia Breasts:  Examined lying and sitting without masses, retractions, discharge or axillary adenopathy. Pelvic:  Ext/BUS/vagina with generalized atrophic changes. First to second-degree rectocele.  Cervix with atrophic changes  Uterus anteverted, normal size, shape and contour, midline and mobile nontender   Adnexa  Without masses or tenderness    Anus and perineum  Normal   Rectovaginal  Normal sphincter tone without palpated masses or tenderness.    Assessment/Plan:  Terri [name] for annual exam.   1. Postmenopausal/atrophic genital changes. Patient doing well without significant hot flashes, night sweats, vaginal dryness or dyspareunia. No vaginal bleeding. Continue to monitor and report any vaginal bleeding. 2. Rectocele. First to second degree, stable over years observation. Some mild pressure symptoms. We have discussed options to include observation, pessary and posterior colporrhaphy. Patient is not interested in doing anything but watching for now. Will follow up if it becomes an  issue. 3. Osteopenia. DEXA 04/2013 with T score -2.1. FRAX 19%/1.4%. Repeat DEXA next year at two-year interval. Increased calcium vitamin D reviewed. Check vitamin D level today. 4. Pap smear HPV negative 01/2013. No Pap smear done today. No history of significant abnormal Pap smears previously. 5. Colonoscopy 2009 with recommended repeat interval 10 years per her history. 6. Mammography 09/2013. Patient in the process of arranging. SBE monthly reviewed. 7. Health maintenance. Baseline CBC, comprehensive metabolic panel, lipid profile, urinalysis, TSH and vitamin D ordered. Follow up in one year, sooner as needed.     Anastasio Auerbach MD, 2:48 PM 09/04/2014

## 2014-09-05 ENCOUNTER — Other Ambulatory Visit: Payer: Self-pay | Admitting: Gynecology

## 2014-09-05 DIAGNOSIS — E7800 Pure hypercholesterolemia, unspecified: Secondary | ICD-10-CM

## 2014-09-05 DIAGNOSIS — E559 Vitamin D deficiency, unspecified: Secondary | ICD-10-CM

## 2014-09-05 LAB — URINALYSIS W MICROSCOPIC + REFLEX CULTURE
Bacteria, UA: NONE SEEN
Bilirubin Urine: NEGATIVE
CASTS: NONE SEEN
Glucose, UA: NEGATIVE mg/dL
Hgb urine dipstick: NEGATIVE
Ketones, ur: NEGATIVE mg/dL
Nitrite: NEGATIVE
Protein, ur: NEGATIVE mg/dL
Specific Gravity, Urine: 1.03 (ref 1.005–1.030)
Squamous Epithelial / LPF: NONE SEEN
Urobilinogen, UA: 0.2 mg/dL (ref 0.0–1.0)
pH: 5 (ref 5.0–8.0)

## 2014-09-05 LAB — TSH: TSH: 2.141 u[IU]/mL (ref 0.350–4.500)

## 2014-09-05 LAB — VITAMIN D 25 HYDROXY (VIT D DEFICIENCY, FRACTURES): VIT D 25 HYDROXY: 13 ng/mL — AB (ref 30–100)

## 2014-09-06 LAB — URINE CULTURE
COLONY COUNT: NO GROWTH
Organism ID, Bacteria: NO GROWTH

## 2014-09-24 ENCOUNTER — Other Ambulatory Visit: Payer: Self-pay | Admitting: Gynecology

## 2014-09-24 DIAGNOSIS — Z1231 Encounter for screening mammogram for malignant neoplasm of breast: Secondary | ICD-10-CM

## 2014-09-26 ENCOUNTER — Ambulatory Visit (HOSPITAL_COMMUNITY)
Admission: RE | Admit: 2014-09-26 | Discharge: 2014-09-26 | Disposition: A | Discharge status: Home / Self Care | Payer: 59 | Source: Ambulatory Visit | Attending: Gynecology | Admitting: Gynecology

## 2014-09-26 DIAGNOSIS — Z1231 Encounter for screening mammogram for malignant neoplasm of breast: Secondary | ICD-10-CM | POA: Diagnosis not present

## 2015-10-09 ENCOUNTER — Encounter: Payer: 59 | Admitting: Gynecology

## 2015-11-01 ENCOUNTER — Encounter: Payer: Self-pay | Admitting: Gynecology

## 2015-11-01 ENCOUNTER — Ambulatory Visit (INDEPENDENT_AMBULATORY_CARE_PROVIDER_SITE_OTHER): Payer: 59 | Admitting: Gynecology

## 2015-11-01 VITALS — BP 122/82 | Ht 62.0 in | Wt 161.0 lb

## 2015-11-01 DIAGNOSIS — Z13220 Encounter for screening for lipoid disorders: Secondary | ICD-10-CM | POA: Diagnosis not present

## 2015-11-01 DIAGNOSIS — N816 Rectocele: Secondary | ICD-10-CM | POA: Diagnosis not present

## 2015-11-01 DIAGNOSIS — Z01419 Encounter for gynecological examination (general) (routine) without abnormal findings: Secondary | ICD-10-CM

## 2015-11-01 DIAGNOSIS — M8580 Other specified disorders of bone density and structure, unspecified site: Secondary | ICD-10-CM

## 2015-11-01 DIAGNOSIS — N952 Postmenopausal atrophic vaginitis: Secondary | ICD-10-CM

## 2015-11-01 DIAGNOSIS — Z1329 Encounter for screening for other suspected endocrine disorder: Secondary | ICD-10-CM

## 2015-11-01 NOTE — Progress Notes (Signed)
    Terri [name] February 14, 1951 [id number]        65 y.o.  [id number]  for annual exam.  Doing well without complaints  Past medical history,surgical history, problem list, medications, allergies, family history and social history were all reviewed and documented as reviewed in the EPIC chart.  ROS:  Performed with pertinent positives and negatives included in the history, assessment and plan.   Additional significant findings :  none   Exam: Terri [name] Vitals:   11/01/15 1420  BP: 122/82  Height: 5\' 2"  (1.575 m)  Weight: 161 lb (73.029 kg)   General appearance:  Normal affect, orientation and appearance. Skin: Grossly normal HEENT: Without gross lesions.  No cervical or supraclavicular adenopathy. Thyroid normal.  Lungs:  Clear without wheezing, rales or rhonchi Cardiac: RR, without RMG Abdominal:  Soft, nontender, without masses, guarding, rebound, organomegaly or hernia Breasts:  Examined lying and sitting without masses, retractions, discharge or axillary adenopathy. Pelvic:  Ext/BUS/vagina with atrophic changes.  First to second-degree rectocele.  Cervix atrophic. Pap smear done  Uterus anteverted, normal size, shape and contour, midline and mobile nontender   Adnexa without masses or tenderness    Anus and perineum normal   Rectovaginal normal sphincter tone without palpated masses or tenderness.    Assessment/Plan:  65 y.o. [id number] female for annual exam.   1. Post menopausal/atrophic genital changes. Without significant hot flashes, night sweats, vaginal dryness or any vaginal bleeding. Continue to monitor and report any issues or vaginal bleeding. 2. Rectocele. First to second-degree. Stable on serial exams. Asymptomatic to the patient. We'll continue to monitor and follow up if symptoms develop. 3. Osteopenia. DEXA 04/2013 T score -2.1 FRAX 19%/1.4%. Repeat DEXA now. Check vitamin D level. 4. Pap smear 2014. Pap smear done today. No history of  significant abnormal Pap smears previously. 5. Mammography 09/2014. Patient due now and will schedule. SBE monthly reviewed. 6. Colonoscopy 2009. Follow up at recommended repeat interval. 7. Health maintenance. Future orders for fasting CBC, CMP, lipid profile, TSH, vitamin D and urinalysis. Follow up for DEXA otherwise 1 year, sooner as needed.   Anastasio Auerbach MD, 2:48 PM 11/01/2015

## 2015-11-01 NOTE — Addendum Note (Signed)
Addended by: Burnett Kanaris on: 11/01/2015 04:09 PM   Modules accepted: Orders

## 2015-11-01 NOTE — Patient Instructions (Signed)
Follow up for fasting lab work. Follow up for bone density as scheduled.  Menopause is a normal process in which your reproductive ability comes to an end. This process happens gradually over a span of months to years, usually between the ages of 31 and 18. Menopause is complete when you have missed 12 consecutive menstrual periods. It is important to talk with your health care provider about some of the most common conditions that affect postmenopausal women, such as heart disease, cancer, and bone loss (osteoporosis). Adopting a healthy lifestyle and getting preventive care can help to promote your health and wellness. Those actions can also lower your chances of developing some of these common conditions. WHAT SHOULD I KNOW ABOUT MENOPAUSE? During menopause, you may experience a number of symptoms, such as:  Moderate-to-severe hot flashes.  Night sweats.  Decrease in sex drive.  Mood swings.  Headaches.  Tiredness.  Irritability.  Memory problems.  Insomnia. Choosing to treat or not to treat menopausal changes is an individual decision that you make with your health care provider. WHAT SHOULD I KNOW ABOUT HORMONE REPLACEMENT THERAPY AND SUPPLEMENTS? Hormone therapy products are effective for treating symptoms that are associated with menopause, such as hot flashes and night sweats. Hormone replacement carries certain risks, especially as you become older. If you are thinking about using estrogen or estrogen with progestin treatments, discuss the benefits and risks with your health care provider. WHAT SHOULD I KNOW ABOUT HEART DISEASE AND STROKE? Heart disease, heart attack, and stroke become more likely as you age. This may be due, in part, to the hormonal changes that your body experiences during menopause. These can affect how your body processes dietary fats, triglycerides, and cholesterol. Heart attack and stroke are both medical emergencies. There are many things that you can do  to help prevent heart disease and stroke:  Have your blood pressure checked at least every 1-2 years. High blood pressure causes heart disease and increases the risk of stroke.  If you are 83-2 years old, ask your health care provider if you should take aspirin to prevent a heart attack or a stroke.  Do not use any tobacco products, including cigarettes, chewing tobacco, or electronic cigarettes. If you need help quitting, ask your health care provider.  It is important to eat a healthy diet and maintain a healthy weight.  Be sure to include plenty of vegetables, fruits, low-fat dairy products, and lean protein.  Avoid eating foods that are high in solid fats, added sugars, or salt (sodium).  Get regular exercise. This is one of the most important things that you can do for your health.  Try to exercise for at least 150 minutes each week. The type of exercise that you do should increase your heart rate and make you sweat. This is known as moderate-intensity exercise.  Try to do strengthening exercises at least twice each week. Do these in addition to the moderate-intensity exercise.  Know your numbers.Ask your health care provider to check your cholesterol and your blood glucose. Continue to have your blood tested as directed by your health care provider. WHAT SHOULD I KNOW ABOUT CANCER SCREENING? There are several types of cancer. Take the following steps to reduce your risk and to catch any cancer development as early as possible. Breast Cancer  Practice breast self-awareness.  This means understanding how your breasts normally appear and feel.  It also means doing regular breast self-exams. Let your health care provider know about any changes, no matter  how small.  If you are 41 or older, have a clinician do a breast exam (clinical breast exam or CBE) every year. Depending on your age, family history, and medical history, it may be recommended that you also have a yearly breast  X-ray (mammogram).  If you have a family history of breast cancer, talk with your health care provider about genetic screening.  If you are at high risk for breast cancer, talk with your health care provider about having an MRI and a mammogram every year.  Breast cancer (BRCA) gene test is recommended for women who have family members with BRCA-related cancers. Results of the assessment will determine the need for genetic counseling and BRCA1 and for BRCA2 testing. BRCA-related cancers include these types:  Breast. This occurs in males or females.  Ovarian.  Tubal. This may also be called fallopian tube cancer.  Cancer of the abdominal or pelvic lining (peritoneal cancer).  Prostate.  Pancreatic. Cervical, Uterine, and Ovarian Cancer Your health care provider may recommend that you be screened regularly for cancer of the pelvic organs. These include your ovaries, uterus, and vagina. This screening involves a pelvic exam, which includes checking for microscopic changes to the surface of your cervix (Pap test).  For women ages 21-65, health care providers may recommend a pelvic exam and a Pap test every three years. For women ages 62-65, they may recommend the Pap test and pelvic exam, combined with testing for human papilloma virus (HPV), every five years. Some types of HPV increase your risk of cervical cancer. Testing for HPV may also be done on women of any age who have unclear Pap test results.  Other health care providers may not recommend any screening for nonpregnant women who are considered low risk for pelvic cancer and have no symptoms. Ask your health care provider if a screening pelvic exam is right for you.  If you have had past treatment for cervical cancer or a condition that could lead to cancer, you need Pap tests and screening for cancer for at least 20 years after your treatment. If Pap tests have been discontinued for you, your risk factors (such as having a new sexual  partner) need to be reassessed to determine if you should start having screenings again. Some women have medical problems that increase the chance of getting cervical cancer. In these cases, your health care provider may recommend that you have screening and Pap tests more often.  If you have a family history of uterine cancer or ovarian cancer, talk with your health care provider about genetic screening.  If you have vaginal bleeding after reaching menopause, tell your health care provider.  There are currently no reliable tests available to screen for ovarian cancer. Lung Cancer Lung cancer screening is recommended for adults 5-6 years old who are at high risk for lung cancer because of a history of smoking. A yearly low-dose CT scan of the lungs is recommended if you:  Currently smoke.  Have a history of at least 30 pack-years of smoking and you currently smoke or have quit within the past 15 years. A pack-year is smoking an average of one pack of cigarettes per day for one year. Yearly screening should:  Continue until it has been 15 years since you quit.  Stop if you develop a health problem that would prevent you from having lung cancer treatment. Colorectal Cancer  This type of cancer can be detected and can often be prevented.  Routine colorectal cancer screening  usually begins at age 47 and continues through age 63.  If you have risk factors for colon cancer, your health care provider may recommend that you be screened at an earlier age.  If you have a family history of colorectal cancer, talk with your health care provider about genetic screening.  Your health care provider may also recommend using home test kits to check for hidden blood in your stool.  A small camera at the end of a tube can be used to examine your colon directly (sigmoidoscopy or colonoscopy). This is done to check for the earliest forms of colorectal cancer.  Direct examination of the colon should be  repeated every 5-10 years until age 16. However, if early forms of precancerous polyps or small growths are found or if you have a family history or genetic risk for colorectal cancer, you may need to be screened more often. Skin Cancer  Check your skin from head to toe regularly.  Monitor any moles. Be sure to tell your health care provider:  About any new moles or changes in moles, especially if there is a change in a mole's shape or color.  If you have a mole that is larger than the size of a pencil eraser.  If any of your family members has a history of skin cancer, especially at a young age, talk with your health care provider about genetic screening.  Always use sunscreen. Apply sunscreen liberally and repeatedly throughout the day.  Whenever you are outside, protect yourself by wearing long sleeves, pants, a wide-brimmed hat, and sunglasses. WHAT SHOULD I KNOW ABOUT OSTEOPOROSIS? Osteoporosis is a condition in which bone destruction happens more quickly than new bone creation. After menopause, you may be at an increased risk for osteoporosis. To help prevent osteoporosis or the bone fractures that can happen because of osteoporosis, the following is recommended:  If you are 83-21 years old, get at least 1,000 mg of calcium and at least 600 mg of vitamin D per day.  If you are older than age 38 but younger than age 51, get at least 1,200 mg of calcium and at least 600 mg of vitamin D per day.  If you are older than age 74, get at least 1,200 mg of calcium and at least 800 mg of vitamin D per day. Smoking and excessive alcohol intake increase the risk of osteoporosis. Eat foods that are rich in calcium and vitamin D, and do weight-bearing exercises several times each week as directed by your health care provider. WHAT SHOULD I KNOW ABOUT HOW MENOPAUSE AFFECTS [place]? Depression may occur at any age, but it is more common as you become older. Common symptoms of depression  include:  Low or sad mood.  Changes in sleep patterns.  Changes in appetite or eating patterns.  Feeling an overall lack of motivation or enjoyment of activities that you previously enjoyed.  Frequent crying spells. Talk with your health care provider if you think that you are experiencing depression. WHAT SHOULD I KNOW ABOUT IMMUNIZATIONS? It is important that you get and maintain your immunizations. These include:  Tetanus, diphtheria, and pertussis (Tdap) booster vaccine.  Influenza every year before the flu season begins.  Pneumonia vaccine.  Shingles vaccine. Your health care provider may also recommend other immunizations.   This information is not intended to replace advice given to you by your health care provider. Make sure you discuss any questions you have with your health care provider.   Document  Released: 09/11/2005 Document Revised: 08/10/2014 Document Reviewed: 03/22/2014 Elsevier Interactive Patient Education [employer].

## 2015-11-04 ENCOUNTER — Telehealth: Payer: Self-pay | Admitting: *Deleted

## 2015-11-04 LAB — PAP IG W/ RFLX HPV ASCU

## 2015-11-04 NOTE — Telephone Encounter (Signed)
Pt left message in triage stating someone from the office called her and didn't leave a message, I saw that pt never stopped by lab on 11/01/15 OV. I called pt back and told I couldn't see where anything else was needed besides the blood work and Dr.Fontaine nurse Fredia Sorrow probably called her to let her know labs where not done.

## 2015-11-28 ENCOUNTER — Telehealth: Payer: 59 | Admitting: Physician Assistant

## 2015-11-28 DIAGNOSIS — J019 Acute sinusitis, unspecified: Secondary | ICD-10-CM | POA: Diagnosis not present

## 2015-11-28 MED ORDER — ALBUTEROL SULFATE HFA 108 (90 BASE) MCG/ACT IN AERS: 2.0000 | INHALATION_SPRAY | Freq: Four times a day (QID) | RESPIRATORY_TRACT | Status: DC | PRN

## 2015-11-28 MED ORDER — FLUTICASONE PROPIONATE 50 MCG/ACT NA SUSP: 2.0000 | Freq: Every day | NASAL | Status: DC

## 2015-11-28 MED ORDER — AZITHROMYCIN 250 MG PO TABS: ORAL_TABLET | ORAL | Status: DC

## 2015-11-28 NOTE — Addendum Note (Signed)
Addended by: Raiford Noble on: 11/28/2015 02:50 PM   Modules accepted: Orders

## 2015-11-28 NOTE — Addendum Note (Signed)
Addended by: Raiford Noble on: 11/28/2015 03:14 PM   Modules accepted: Orders

## 2015-11-28 NOTE — Progress Notes (Signed)

## 2016-02-24 ENCOUNTER — Ambulatory Visit (HOSPITAL_COMMUNITY)
Admission: EM | Admit: 2016-02-24 | Discharge: 2016-02-24 | Disposition: A | Discharge status: Home / Self Care | Payer: 59 | Attending: Emergency Medicine | Admitting: Emergency Medicine

## 2016-02-24 ENCOUNTER — Encounter (HOSPITAL_COMMUNITY): Payer: Self-pay | Admitting: Emergency Medicine

## 2016-02-24 DIAGNOSIS — L200 Besnier's prurigo: Secondary | ICD-10-CM

## 2016-02-24 DIAGNOSIS — L239 Allergic contact dermatitis, unspecified cause: Secondary | ICD-10-CM

## 2016-02-24 MED ORDER — METHYLPREDNISOLONE ACETATE 80 MG/ML IJ SUSP
INTRAMUSCULAR | Status: AC
  Filled 2016-02-24: qty 1

## 2016-02-24 MED ORDER — METHYLPREDNISOLONE ACETATE 80 MG/ML IJ SUSP
80.0000 mg | Freq: Once | INTRAMUSCULAR | Status: AC
  Administered 2016-02-24: 80 mg via INTRAMUSCULAR

## 2016-02-24 MED ORDER — TRIAMCINOLONE ACETONIDE 0.1 % EX CREA
1.0000 | TOPICAL_CREAM | Freq: Two times a day (BID) | CUTANEOUS | 0 refills | Status: DC
Start: 2016-02-24 — End: 2018-07-20

## 2016-02-24 MED FILL — TRIAMCINOLONE 0.1% CREAM: 0.1 | 10 days supply | Qty: 30 | Fill #0

## 2016-02-24 NOTE — Discharge Instructions (Signed)
You are having a reaction to something, but I'm not sure what. We gave you a shot today. You should see some improvement over the next 6-8 hours. Use the triamcinolone cream twice a day for the next several days. This should help with the itching. It is okay to continue to do cold compresses or ice to help temporarily. It is okay to take Benadryl, but this will make you sleepy. Follow-up with Dr. Allyson Sabal if this is not improving in the next 48 hours.

## 2016-02-24 NOTE — ED Triage Notes (Signed)
Itchy rash to chest since Saturday.  Each day rash gets worse

## 2016-02-24 NOTE — ED Provider Notes (Signed)
[facility]    CSN: [id number] Arrival date & time: 02/24/16  1001  First Provider Contact:  First MD Initiated Contact with Patient 02/24/16 1038        History   Chief Complaint No chief complaint on file.   HPI Terri [name] is a 65 y.o. female.   She is a 65 year old woman here for evaluation of rash. The rash started on her upper chest 2 days ago and has gradually been getting worse.  Initially, it was just some mild redness and itchiness. Now, it is itching and burning and more welts. It has spread to involve her anterior neck some. She denies any new lotions or detergents. She has been out in the sun over the last 3-4 days, but did use sunscreen. No known exposure to poison ivy or other plants. No new medications. No rash anywhere else. She states her throat feels a little scratchy, but denies any pain or sensation of swelling. She has tried Benadryl cream, [place] cream, and aloe vera without improvement. The only thing that helps is cool compresses.    Past Medical History:  Diagnosis Date  . Asthma    hx no meds  . Contact lens/glasses fitting    wears contacts or glasses  . Osteopenia 04/2013   t score -2.1FRAX 19%/1.4%  . Shingles     There are no active problems to display for this patient.   Past Surgical History:  Procedure Laterality Date  . COLONOSCOPY    . METATARSAL OSTEOTOMY WITH BUNIONECTOMY Right 09/19/2013   Procedure: RIGHT FOOT CHEVRON OSTEOTOMY AND 2ND TOE RESECTION  ARTHOPLASTY with pin fixation;  Surgeon: Hessie Dibble, MD;  Location: [facility];  Service: Orthopedics;  Laterality: Right;  . TUBAL LIGATION    . WISDOM TOOTH EXTRACTION      OB History    Gravida Para Term Preterm AB Living   4 4 4     4    SAB TAB Ectopic Multiple Live Births                   Home Medications    Prior to Admission medications   Medication Sig Start Date End Date Taking? Authorizing Provider  albuterol (PROVENTIL  HFA;VENTOLIN HFA) 108 (90 Base) MCG/ACT inhaler Inhale 2 puffs into the lungs every 6 (six) hours as needed for wheezing or shortness of breath. 11/28/15   Brunetta Jeans, PA-C  cholecalciferol (VITAMIN D) 1000 UNITS tablet Take 1,000 Units by mouth daily.    Historical Provider, MD  fluticasone (FLONASE) 50 MCG/ACT nasal spray Place 2 sprays into both nostrils daily. 11/28/15   Brunetta Jeans, PA-C  triamcinolone cream (KENALOG) 0.1 % Apply 1 application topically 2 (two) times daily. 02/24/16   Melony Overly, MD    Family History Family History  Problem Relation Age of Onset  . Heart disease Mother   . Lung cancer Mother   . Hypertension Mother   . Hyperlipidemia Mother   . Hypertension Father   . Stroke Father   . Hyperlipidemia Father   . Breast cancer Maternal Aunt 52  . Breast cancer Paternal Aunt 66  . Lung cancer Maternal Grandfather   . Aneurysm Paternal Grandmother   . Lung cancer Paternal Grandfather   . Aneurysm Cousin     4 Maternal 1st cousins    Social History Social History  Substance Use Topics  . Smoking status: Never Smoker  . Smokeless tobacco: Not on file  .  Alcohol use 0.0 oz/week     Comment: Occas.     Allergies   Aspirin and Penicillins   Review of Systems Review of Systems  Constitutional: Negative for fever.  HENT: Negative for sore throat, trouble swallowing and voice change.   Respiratory: Negative for shortness of breath and wheezing.   Skin: Positive for rash.     Physical Exam Triage Vital Signs ED Triage Vitals [02/24/16 1036]  Enc Vitals Group     BP 135/70     Pulse Rate 74     Resp      Temp 97.8 F (36.6 C)     Temp Source Oral     SpO2 100 %     Weight      Height      Head Circumference      Peak Flow      Pain Score      Pain Loc      Pain Edu?      Excl. in [place]?    No data found.   Updated Vital Signs BP 135/70 (BP Location: Left Arm)   Pulse 74   Temp 97.8 F (36.6 C) (Oral)   SpO2 100%   Physical  Exam  Constitutional: She is oriented to person, place, and time. She appears well-developed and well-nourished. No distress.  Cardiovascular: Normal rate.   Pulmonary/Chest: Effort normal.  Neurological: She is alert and oriented to person, place, and time.  Skin: Rash (erythematous wheal type rash to bilateral upper chest and anterior neck) noted.     UC Treatments / Results  Labs (all labs ordered are listed, but only abnormal results are displayed) Labs Reviewed - No data to display  EKG  EKG Interpretation None       Radiology No results found.  Procedures Procedures (including critical care time)  Medications Ordered in UC Medications  methylPREDNISolone acetate (DEPO-MEDROL) injection 80 mg (80 mg Intramuscular Given 02/24/16 1118)     Initial Impression / Assessment and Plan / UC Course  I have reviewed the triage vital signs and the nursing notes.  Pertinent labs & imaging results that were available during my care of the patient were reviewed by me and considered in my medical decision making (see chart for details).  Clinical Course    This is consistent with a contact dermatitis. I'm unsure what the allergen is as she denies any new exposures and no exposure to plants.  Treat symptomatically with Depo-Medrol here and triamcinolone cream at home.  She sees Dr. Allyson Sabal for regular skin checks and will follow-up with him if not improving.  Final Clinical Impressions(s) / UC Diagnoses   Final diagnoses:  Allergic dermatitis    New Prescriptions New Prescriptions   TRIAMCINOLONE CREAM (KENALOG) 0.1 %    Apply 1 application topically 2 (two) times daily.     Melony Overly, MD 02/24/16 1121

## 2016-08-01 DIAGNOSIS — H5213 Myopia, bilateral: Secondary | ICD-10-CM | POA: Diagnosis not present

## 2016-08-01 DIAGNOSIS — H524 Presbyopia: Secondary | ICD-10-CM | POA: Diagnosis not present

## 2016-11-02 ENCOUNTER — Encounter: Payer: Self-pay | Admitting: Gynecology

## 2016-11-02 ENCOUNTER — Ambulatory Visit (INDEPENDENT_AMBULATORY_CARE_PROVIDER_SITE_OTHER): Payer: 59 | Admitting: Gynecology

## 2016-11-02 VITALS — BP 124/80 | Ht 61.0 in | Wt 164.0 lb

## 2016-11-02 DIAGNOSIS — N952 Postmenopausal atrophic vaginitis: Secondary | ICD-10-CM | POA: Diagnosis not present

## 2016-11-02 DIAGNOSIS — Z13220 Encounter for screening for lipoid disorders: Secondary | ICD-10-CM | POA: Diagnosis not present

## 2016-11-02 DIAGNOSIS — N816 Rectocele: Secondary | ICD-10-CM

## 2016-11-02 DIAGNOSIS — Z01411 Encounter for gynecological examination (general) (routine) with abnormal findings: Secondary | ICD-10-CM | POA: Diagnosis not present

## 2016-11-02 DIAGNOSIS — M8580 Other specified disorders of bone density and structure, unspecified site: Secondary | ICD-10-CM

## 2016-11-02 LAB — LIPID PANEL
CHOLESTEROL: 195 mg/dL (ref <200)
HDL: 60 mg/dL (ref >50)
LDL Cholesterol: 92 mg/dL (ref <100)
Total CHOL/HDL Ratio: 3.3 Ratio (ref <5.0)
Triglycerides: 216 mg/dL — ABNORMAL HIGH (ref <150)
VLDL: 43 mg/dL — ABNORMAL HIGH (ref <30)

## 2016-11-02 LAB — CBC WITH DIFFERENTIAL/PLATELET
BASOS ABS: 0 {cells}/uL (ref 0–200)
Basophils Relative: 0 %
EOS ABS: 216 {cells}/uL (ref 15–500)
EOS PCT: 3 %
HCT: 36.7 % (ref 35.0–45.0)
HEMOGLOBIN: 12 g/dL (ref 11.7–15.5)
Lymphocytes Relative: 41 %
Lymphs Abs: 2952 cells/uL (ref 850–3900)
MCH: 28.4 pg (ref 27.0–33.0)
MCHC: 32.7 g/dL (ref 32.0–36.0)
MCV: 87 fL (ref 80.0–100.0)
MPV: 10.3 fL (ref 7.5–12.5)
Monocytes Absolute: 720 cells/uL (ref 200–950)
Monocytes Relative: 10 %
NEUTROS ABS: 3312 {cells}/uL (ref 1500–7800)
Neutrophils Relative %: 46 %
PLATELETS: 301 10*3/uL (ref 140–400)
RBC: 4.22 MIL/uL (ref 3.80–5.10)
RDW: 15.1 % — ABNORMAL HIGH (ref 11.0–15.0)
WBC: 7.2 10*3/uL (ref 3.8–10.8)

## 2016-11-02 LAB — URINALYSIS W MICROSCOPIC + REFLEX CULTURE
Bacteria, UA: NONE SEEN [HPF]
Bilirubin Urine: NEGATIVE
Casts: NONE SEEN [LPF]
Crystals: NONE SEEN [HPF]
Glucose, UA: NEGATIVE
HGB URINE DIPSTICK: NEGATIVE
Ketones, ur: NEGATIVE
NITRITE: NEGATIVE
PROTEIN: NEGATIVE
SPECIFIC GRAVITY, URINE: 1.013 (ref 1.001–1.035)
Yeast: NONE SEEN [HPF]
pH: 7 (ref 5.0–8.0)

## 2016-11-02 LAB — COMPREHENSIVE METABOLIC PANEL
ALT: 13 U/L (ref 6–29)
AST: 13 U/L (ref 10–35)
Albumin: 4.2 g/dL (ref 3.6–5.1)
Alkaline Phosphatase: 112 U/L (ref 33–130)
BUN: 11 mg/dL (ref 7–25)
CO2: 26 mmol/L (ref 20–31)
CREATININE: 0.81 mg/dL (ref 0.50–0.99)
Calcium: 9.2 mg/dL (ref 8.6–10.4)
Chloride: 103 mmol/L (ref 98–110)
Glucose, Bld: 76 mg/dL (ref 65–99)
Potassium: 4.2 mmol/L (ref 3.5–5.3)
SODIUM: 138 mmol/L (ref 135–146)
TOTAL PROTEIN: 7.3 g/dL (ref 6.1–8.1)
Total Bilirubin: 0.3 mg/dL (ref 0.2–1.2)

## 2016-11-02 NOTE — Patient Instructions (Signed)
Follow up for the bone density as scheduled.  Follow up for your mammogram.

## 2016-11-02 NOTE — Progress Notes (Signed)
    Terri [name] 1950-10-25 [id number]        66 y.o.  [id number] for annual exam.    Past medical history,surgical history, problem list, medications, allergies, family history and social history were all reviewed and documented as reviewed in the EPIC chart.  ROS:  Performed with pertinent positives and negatives included in the history, assessment and plan.   Additional significant findings :  None   Exam: Copywriter, advertising Vitals:   11/02/16 1426  BP: 124/80  Weight: 164 lb (74.4 kg)  Height: 5\' 1"  (1.549 m)   Body mass index is 30.99 kg/m.  General appearance:  Normal affect, orientation and appearance. Skin: Grossly normal HEENT: Without gross lesions.  No cervical or supraclavicular adenopathy. Thyroid normal.  Lungs:  Clear without wheezing, rales or rhonchi Cardiac: RR, without RMG Abdominal:  Soft, nontender, without masses, guarding, rebound, organomegaly or hernia Breasts:  Examined lying and sitting without masses, retractions, discharge or axillary adenopathy. Pelvic:  Ext, BUS, Vagina: With atrophic changes. First to second-degree rectocele. No appreciable cystocele. No appreciable uterine prolapse  Cervix: With atrophic changes  Uterus: Anteverted, normal size, shape and contour, midline and mobile nontender   Adnexa: Without masses or tenderness    Anus and perineum: Normal   Rectovaginal: Normal sphincter tone without palpated masses or tenderness.    Assessment/Plan:  66 y.o. [id number] female for annual exam.   1. Rectocele. Stable over serial exams. Patient does note some symptoms with pressure with bowel movements. Is contemplating having surgery. I reviewed options to include observation, pessary and surgery. It appears to be an isolated rectocele without significant uterine prolapse or cystocele. Would recommend a rectocele repair only at this time if she decides to proceed with surgery and she will call me if she decides to do  so. 2. Postmenopausal/atrophic genital changes. No significant hot flushes, night sweats, vaginal dryness or any bleeding. Continue to monitor report any issues or bleeding. 3. Mammography overdo and patient knows to call and schedule. SBE monthly reviewed. 4. Osteopenia. DEXA 04/2013 T score -2.1 FRAX 19%/1.4%. Schedule DEXA now and patient agrees to do so. Increase calcium vitamin D. Check vitamin D level today. 5. Colonoscopy 2009. Plan repeat colonoscopy next year at 10 year interval. 6. Pap smear 2017. No Pap smear done today. No history of abnormal Pap smears. 7. Health maintenance. Baseline CBC, CMP, lipid profile, TSH, vitamin D and urinalysis ordered. Follow up for DEXA. Follow up if she decides to proceed with rectocele repair. Follow up in one year for annual exam.   Anastasio Auerbach MD, 2:45 PM 11/02/2016

## 2016-11-03 ENCOUNTER — Other Ambulatory Visit: Payer: Self-pay | Admitting: *Deleted

## 2016-11-03 DIAGNOSIS — E559 Vitamin D deficiency, unspecified: Secondary | ICD-10-CM

## 2016-11-03 LAB — VITAMIN D 25 HYDROXY (VIT D DEFICIENCY, FRACTURES): VIT D 25 HYDROXY: 12 ng/mL — AB (ref 30–100)

## 2016-11-03 LAB — URINE CULTURE: Organism ID, Bacteria: NO GROWTH

## 2016-11-03 LAB — TSH: TSH: 3.56 mIU/L

## 2016-11-03 MED ORDER — VITAMIN D (ERGOCALCIFEROL) 1.25 MG (50000 UNIT) PO CAPS: ORAL_CAPSULE | ORAL | 0 refills | Status: DC

## 2016-11-03 MED FILL — VIT D2 1.25 MG (50,000 UNIT: 1.25 MG | 84 days supply | Qty: 12 | Fill #0

## 2016-11-24 DIAGNOSIS — Z1211 Encounter for screening for malignant neoplasm of colon: Secondary | ICD-10-CM | POA: Diagnosis not present

## 2016-11-24 DIAGNOSIS — Z800 Family history of malignant neoplasm of digestive organs: Secondary | ICD-10-CM | POA: Diagnosis not present

## 2016-11-24 DIAGNOSIS — R194 Change in bowel habit: Secondary | ICD-10-CM | POA: Diagnosis not present

## 2016-12-08 ENCOUNTER — Telehealth: Payer: 59 | Admitting: Family

## 2016-12-08 DIAGNOSIS — B9689 Other specified bacterial agents as the cause of diseases classified elsewhere: Secondary | ICD-10-CM

## 2016-12-08 DIAGNOSIS — J019 Acute sinusitis, unspecified: Secondary | ICD-10-CM

## 2016-12-08 MED ORDER — FLUTICASONE PROPIONATE 50 MCG/ACT NA SUSP: 2.0000 | Freq: Every day | NASAL | 0 refills | Status: DC

## 2016-12-08 MED ORDER — AZITHROMYCIN 250 MG PO TABS: ORAL_TABLET | ORAL | 0 refills | Status: DC

## 2016-12-08 MED FILL — AZITHROMYCIN 250 MG TABLET: 250 | 5 days supply | Qty: 6 | Fill #0

## 2016-12-08 MED FILL — FLUTICASONE PROP 50 MCG SPR: 50 | 30 days supply | Qty: 16 | Fill #0

## 2016-12-08 NOTE — Progress Notes (Signed)
We are sorry that you are not feeling well.  Here is how we plan to help!  Based on what you have shared with me it looks like you have sinusitis.  Sinusitis is inflammation and infection in the sinus cavities of the head.  Based on your presentation I believe you most likely have Acute Bacterial Sinusitis.  This is an infection caused by bacteria and is treated with antibiotics. I have prescribed Zpak as directed and Flonase to help with congestion and dizziness. You may use an oral decongestant such as Mucinex D or if you have glaucoma or high blood pressure use plain Mucinex. Saline nasal spray help and can safely be used as often as needed for congestion.  If you develop worsening sinus pain, fever or notice severe headache and vision changes, or if symptoms are not better after completion of antibiotic, please schedule an appointment with a health care provider.    Sinus infections are not as easily transmitted as other respiratory infection, however we still recommend that you avoid close contact with loved ones, especially the very young and elderly.  Remember to wash your hands thoroughly throughout the day as this is the number one way to prevent the spread of infection!  Home Care:  Only take medications as instructed by your medical team.  Complete the entire course of an antibiotic.  Do not take these medications with alcohol.  A steam or ultrasonic humidifier can help congestion.  You can place a towel over your head and breathe in the steam from hot water coming from a faucet.  Avoid close contacts especially the very young and the elderly.  Cover your mouth when you cough or sneeze.  Always remember to wash your hands.  Get Help Right Away If:  You develop worsening fever or sinus pain.  You develop a severe head ache or visual changes.  Your symptoms persist after you have completed your treatment plan.  Make sure you  Understand these instructions.  Will watch your  condition.  Will get help right away if you are not doing well or get worse.  Your e-visit answers were reviewed by a board certified advanced clinical practitioner to complete your personal care plan.  Depending on the condition, your plan could have included both over the counter or prescription medications.  If there is a problem please reply  once you have received a response from your provider.  Your safety is important to [place].  If you have drug allergies check your prescription carefully.    You can use MyChart to ask questions about today's visit, request a non-urgent call back, or ask for a work or school excuse for 24 hours related to this e-Visit. If it has been greater than 24 hours you will need to follow up with your provider, or enter a new e-Visit to address those concerns.  You will get an e-mail in the next two days asking about your experience.  I hope that your e-visit has been valuable and will speed your recovery. Thank you for using e-visits.

## 2016-12-14 ENCOUNTER — Other Ambulatory Visit: Payer: Self-pay | Admitting: Gynecology

## 2016-12-14 ENCOUNTER — Ambulatory Visit (INDEPENDENT_AMBULATORY_CARE_PROVIDER_SITE_OTHER): Payer: 59

## 2016-12-14 DIAGNOSIS — M8589 Other specified disorders of bone density and structure, multiple sites: Secondary | ICD-10-CM

## 2016-12-14 DIAGNOSIS — Z13820 Encounter for screening for osteoporosis: Secondary | ICD-10-CM | POA: Diagnosis not present

## 2016-12-14 DIAGNOSIS — M8580 Other specified disorders of bone density and structure, unspecified site: Secondary | ICD-10-CM

## 2016-12-15 ENCOUNTER — Telehealth: Payer: Self-pay | Admitting: Gynecology

## 2016-12-15 ENCOUNTER — Encounter: Payer: Self-pay | Admitting: Gynecology

## 2016-12-15 NOTE — Telephone Encounter (Signed)
Tell patient her most recent bone density shows increased fracture risk at her spine. Recommend office visit to discuss treatment options.

## 2016-12-16 NOTE — Telephone Encounter (Signed)
Pt informed, states she will call back to schedule.

## 2016-12-16 NOTE — Telephone Encounter (Signed)
Left message for pt call

## 2017-06-15 DIAGNOSIS — M545 Low back pain: Secondary | ICD-10-CM | POA: Diagnosis not present

## 2017-06-15 MED FILL — CYCLOBENZAPRINE 5 MG TABLET: 5 | 7 days supply | Qty: 20 | Fill #0

## 2017-06-15 MED FILL — DICLOFENAC SODIUM 75 MG TAB: 75 | 15 days supply | Qty: 30 | Fill #0

## 2017-06-16 ENCOUNTER — Ambulatory Visit: Payer: 59 | Admitting: Family Medicine

## 2017-06-16 NOTE — Progress Notes (Deleted)
No chief complaint on file.   HPI  4 review of systems  Past Medical History:  Diagnosis Date  . Asthma    hx no meds  . Contact lens/glasses fitting    wears contacts or glasses  . Osteopenia 12/2016   T score -2.3 FRAX 20%  . Shingles     Current Outpatient Medications  Medication Sig Dispense Refill  . albuterol (PROVENTIL HFA;VENTOLIN HFA) 108 (90 Base) MCG/ACT inhaler Inhale 2 puffs into the lungs every 6 (six) hours as needed for wheezing or shortness of breath. (Patient not taking: Reported on 11/02/2016) 1 Inhaler 0  . azithromycin (ZITHROMAX) 250 MG tablet As directed 6 tablet 0  . cholecalciferol (VITAMIN D) 1000 UNITS tablet Take 1,000 Units by mouth daily.    . Flaxseed, Linseed, (FLAX SEED OIL PO) Take by mouth.    . fluticasone (FLONASE) 50 MCG/ACT nasal spray Place 2 sprays into both nostrils daily. 16 g 0  . triamcinolone cream (KENALOG) 0.1 % Apply 1 application topically 2 (two) times daily. (Patient not taking: Reported on 11/02/2016) 30 g 0  . Vitamin D, Ergocalciferol, (DRISDOL) 50000 units CAPS capsule Taken one tablet by mouth weekly for 12 weeks, then have vitamin d level rechecked at office. 12 capsule 0   No current facility-administered medications for this visit.     Allergies:  Allergies  Allergen Reactions  . Aspirin Swelling  . Penicillins Other (See Comments)    Numbness    Past Surgical History:  Procedure Laterality Date  . COLONOSCOPY    . TUBAL LIGATION    . WISDOM TOOTH EXTRACTION      Social History   Socioeconomic History  . Marital status: Divorced    Spouse name: Not on file  . Number of children: Not on file  . Years of education: Not on file  . Highest education level: Not on file  Social Needs  . Financial resource strain: Not on file  . Food insecurity - worry: Not on file  . Food insecurity - inability: Not on file  . Transportation needs - medical: Not on file  . Transportation needs - non-medical: Not on file    Occupational History  . Not on file  Tobacco Use  . Smoking status: Never Smoker  . Smokeless tobacco: Never Used  Substance and Sexual Activity  . Alcohol use: Yes    Alcohol/week: 0.0 oz    Comment: Occas.  . Drug use: No  . Sexual activity: No    Birth control/protection: Post-menopausal, Surgical    Comment: 1st intercourse 66 yo-Fewer than 5 partners--BTL  Other Topics Concern  . Not on file  Social History Narrative  . Not on file    Family History  Problem Relation Age of Onset  . Heart disease Mother   . Lung cancer Mother   . Hypertension Mother   . Hyperlipidemia Mother   . Hypertension Father   . Stroke Father   . Hyperlipidemia Father   . Breast cancer Maternal Aunt 52  . Breast cancer Paternal Aunt 67  . Lung cancer Maternal Grandfather   . Aneurysm Paternal Grandmother   . Lung cancer Paternal Grandfather   . Aneurysm Cousin        4 Maternal 1st cousins     ROS Review of Systems See HPI Constitution: No fevers or chills No malaise No diaphoresis Skin: No rash or itching Eyes: no blurry vision, no double vision GU: no dysuria or hematuria Neuro: no dizziness  or headaches * all others reviewed and negative   Objective: There were no vitals filed for this visit.  Physical Exam  Assessment and Plan There are no diagnoses linked to this encounter.   William Laske P [employer]

## 2017-07-29 DIAGNOSIS — H524 Presbyopia: Secondary | ICD-10-CM | POA: Diagnosis not present

## 2017-08-26 ENCOUNTER — Telehealth: Payer: 59 | Admitting: Physician Assistant

## 2017-08-26 DIAGNOSIS — J019 Acute sinusitis, unspecified: Secondary | ICD-10-CM | POA: Diagnosis not present

## 2017-08-26 DIAGNOSIS — B9689 Other specified bacterial agents as the cause of diseases classified elsewhere: Secondary | ICD-10-CM | POA: Diagnosis not present

## 2017-08-26 MED ORDER — DOXYCYCLINE HYCLATE 100 MG PO CAPS: 100.0000 mg | ORAL_CAPSULE | Freq: Two times a day (BID) | ORAL | 0 refills | Status: DC

## 2017-08-26 NOTE — Progress Notes (Signed)

## 2017-10-09 ENCOUNTER — Telehealth: Payer: 59 | Admitting: Family

## 2017-10-09 DIAGNOSIS — B9689 Other specified bacterial agents as the cause of diseases classified elsewhere: Secondary | ICD-10-CM | POA: Diagnosis not present

## 2017-10-09 DIAGNOSIS — J329 Chronic sinusitis, unspecified: Secondary | ICD-10-CM | POA: Diagnosis not present

## 2017-10-09 MED ORDER — DOXYCYCLINE HYCLATE 100 MG PO TABS: 100.0000 mg | ORAL_TABLET | Freq: Two times a day (BID) | ORAL | 0 refills | Status: DC

## 2017-10-09 MED ORDER — BENZONATATE 100 MG PO CAPS: 100.0000 mg | ORAL_CAPSULE | Freq: Three times a day (TID) | ORAL | 0 refills | Status: DC | PRN

## 2017-10-09 NOTE — Progress Notes (Signed)
Thank you for the details you included in the comment boxes. Those details are very helpful in determining the best course of treatment for you and help [place] to provide the best care.  We are sorry that you are not feeling well.  Here is how we plan to help!  Based on what you have shared with me it looks like you have sinusitis.  Sinusitis is inflammation and infection in the sinus cavities of the head.  Based on your presentation I believe you most likely have Acute Bacterial Sinusitis.  This is an infection caused by bacteria and is treated with antibiotics. I have prescribed Doxycycline 100mg  by mouth twice a day for 10 days. You may use an oral decongestant such as Mucinex D or if you have glaucoma or high blood pressure use plain Mucinex. Saline nasal spray help and can safely be used as often as needed for congestion.  If you develop worsening sinus pain, fever or notice severe headache and vision changes, or if symptoms are not better after completion of antibiotic, please schedule an appointment with a health care provider.    I have sent Tessalon Perles 100mg  for cough, take 1 or 2 every 8 hours if needed for cough.   Sinus infections are not as easily transmitted as other respiratory infection, however we still recommend that you avoid close contact with loved ones, especially the very young and elderly.  Remember to wash your hands thoroughly throughout the day as this is the number one way to prevent the spread of infection!  Home Care:  Only take medications as instructed by your medical team.  Complete the entire course of an antibiotic.  Do not take these medications with alcohol.  A steam or ultrasonic humidifier can help congestion.  You can place a towel over your head and breathe in the steam from hot water coming from a faucet.  Avoid close contacts especially the very young and the elderly.  Cover your mouth when you cough or sneeze.  Always remember to wash your  hands.  Get Help Right Away If:  You develop worsening fever or sinus pain.  You develop a severe head ache or visual changes.  Your symptoms persist after you have completed your treatment plan.  Make sure you  Understand these instructions.  Will watch your condition.  Will get help right away if you are not doing well or get worse.  Your e-visit answers were reviewed by a board certified advanced clinical practitioner to complete your personal care plan.  Depending on the condition, your plan could have included both over the counter or prescription medications.  If there is a problem please reply  once you have received a response from your provider.  Your safety is important to [place].  If you have drug allergies check your prescription carefully.    You can use MyChart to ask questions about today's visit, request a non-urgent call back, or ask for a work or school excuse for 24 hours related to this e-Visit. If it has been greater than 24 hours you will need to follow up with your provider, or enter a new e-Visit to address those concerns.  You will get an e-mail in the next two days asking about your experience.  I hope that your e-visit has been valuable and will speed your recovery. Thank you for using e-visits.

## 2017-11-03 ENCOUNTER — Encounter: Payer: 59 | Admitting: Gynecology

## 2017-11-03 DIAGNOSIS — Z0289 Encounter for other administrative examinations: Secondary | ICD-10-CM

## 2017-12-08 ENCOUNTER — Ambulatory Visit (INDEPENDENT_AMBULATORY_CARE_PROVIDER_SITE_OTHER): Payer: 59 | Admitting: Orthopaedic Surgery

## 2017-12-08 ENCOUNTER — Encounter (INDEPENDENT_AMBULATORY_CARE_PROVIDER_SITE_OTHER): Payer: Self-pay | Admitting: Orthopaedic Surgery

## 2017-12-08 ENCOUNTER — Ambulatory Visit (INDEPENDENT_AMBULATORY_CARE_PROVIDER_SITE_OTHER): Payer: 59

## 2017-12-08 DIAGNOSIS — M7541 Impingement syndrome of right shoulder: Secondary | ICD-10-CM

## 2017-12-08 DIAGNOSIS — M25551 Pain in right hip: Secondary | ICD-10-CM | POA: Diagnosis not present

## 2017-12-08 DIAGNOSIS — M1612 Unilateral primary osteoarthritis, left hip: Secondary | ICD-10-CM | POA: Diagnosis not present

## 2017-12-08 MED ORDER — METHYLPREDNISOLONE ACETATE 40 MG/ML IJ SUSP
40.0000 mg | INTRAMUSCULAR | Status: AC | PRN
  Administered 2017-12-08: 40 mg via INTRA_ARTICULAR

## 2017-12-08 MED ORDER — LIDOCAINE HCL 1 % IJ SOLN
3.0000 mL | INTRAMUSCULAR | Status: AC | PRN
  Administered 2017-12-08: 3 mL

## 2017-12-08 NOTE — Progress Notes (Signed)
Office Visit Note   Patient: Terri [name]           Date of Birth: 11-21-1950           MRN: [id number] Visit Date: 12/08/2017              Requested by: No referring provider defined for this encounter. PCP: Patient, No Pcp Per   Assessment & Plan: Visit Diagnoses:  1. Pain in right hip   2. Primary osteoarthritis of left hip   3. Shoulder impingement syndrome, right     Plan: She is shown's pendulum, wall crawls and forward flexion exercises for the right shoulder.  We will see her back in 2 weeks check her right shoulder to see what type of response she had from the injection today.  In regards to her left hip we will have her undergo an intra-articular injection with Dr. Ernestina Patches.  Discussed with her the x-ray findings particularly of the left hip today and the fact that she would only need to proceed with hip replacement if all conservative forms of treatment fail.  She agrees with this and would like to put off surgery as long as possible.  Follow-Up Instructions: Return in about 2 weeks (around 12/22/2017).   Orders:  Orders Placed This Encounter  Procedures  . Large Joint Inj  . XR Humerus Right  . XR HIPS BILAT W OR W/O PELVIS 3-4 VIEWS  . Ambulatory referral to Physical Medicine Rehab   No orders of the defined types were placed in this encounter.     Procedures: Large Joint Inj: R subacromial bursa on 12/08/2017 9:47 AM Indications: pain Details: 22 G 1.5 in needle, superior approach  Arthrogram: No  Medications: 3 mL lidocaine 1 %; 40 mg methylPREDNISolone acetate 40 MG/ML Outcome: tolerated well, no immediate complications Procedure, treatment alternatives, risks and benefits explained, specific risks discussed. Consent was given by the patient. Immediately prior to procedure a time out was called to verify the correct patient, procedure, equipment, support staff and site/side marked as required. Patient was prepped and draped in the usual sterile fashion.         Clinical Data: No additional findings.   Subjective: Chief Complaint  Patient presents with  . Right Hip - Pain  . Left Hip - Pain  . Right Upper Arm - Pain    HPI  Terri [name] comes in today due to bilateral hip pain has been ongoing for the past 3 years.  She is done which she has arthritis in her left hip that this bothers her the most.  She is tried Tylenol and NSAIDs with some relief but does not take these on a regular basis.  She is also tried some exercise.  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  She states that the end of the day she has severe left hip pain.  Pain does not awaken her from either hip but does keep her from going to sleep at night.  No radicular symptoms down either leg.  She does not use any assistive devices to ambulate.  Right shoulder pain ongoing for the past Couple of months.  She has soreness in the right upper arm.  No radicular symptoms.  Again she is tried Tylenol and NSAIDs that helps some.  She does note some decreased range of motion particularly with extreme internal rotation.  She had no known injury to the right shoulder.  Review of Systems  Denies any fevers chills shortness of breath chest pain nausea or vomiting.  Objective: Vital Signs: There were no vitals taken for this visit.  Physical Exam  Constitutional: She is oriented to person, place, and time. She appears well-developed and well-nourished. No distress.  Cardiovascular: Intact distal pulses.  Pulmonary/Chest: Effort normal.  Neurological: She is alert and oriented to person, place, and time.  Skin: She is not diaphoretic.  Psychiatric: She has a normal mood and affect.    Ortho Exam Bilateral hip she has good range of motion of the right hip without pain.  Limited internal rotation of the left hip with some discomfort.  Full external rotation left hip.  Calves are supple nontender.  Straight leg raise negative bilaterally.  Full  dorsiflexion plantarflexion bilateral ankles. Bilateral shoulders she has full forward flexion.  5 out of 5 strength with external and internal rotation against resistance empty can test negative bilaterally.  Positive liftoff test on the right negative on the left.  Crossover test on the right causes some mild discomfort.  Minimal discomfort with palpation over the right acromioclavicular joint.  Nontender over the mid humerus.  Biceps strength is 5 out of 5 bilaterally.  Distal biceps tendons are intact and nontender. Specialty Comments:  No specialty comments available.  Imaging: Xr Humerus Right  Result Date: 12/08/2017 Right humerus 2 views: No acute fracture.  The humeral heads well located.  Moderate AC joint changes.  Otherwise no acute findings.    PMFS History: There are no active problems to display for this patient.  Past Medical History:  Diagnosis Date  . Asthma    hx no meds  . Contact lens/glasses fitting    wears contacts or glasses  . Osteopenia 12/2016   T score -2.3 FRAX 20%  . Shingles     Family History  Problem Relation Age of Onset  . Heart disease Mother   . Lung cancer Mother   . Hypertension Mother   . Hyperlipidemia Mother   . Hypertension Father   . Stroke Father   . Hyperlipidemia Father   . Breast cancer Maternal Aunt 52  . Breast cancer Paternal Aunt 23  . Lung cancer Maternal Grandfather   . Aneurysm Paternal Grandmother   . Lung cancer Paternal Grandfather   . Aneurysm Cousin        4 Maternal 1st cousins    Past Surgical History:  Procedure Laterality Date  . COLONOSCOPY    . METATARSAL OSTEOTOMY WITH BUNIONECTOMY Right 09/19/2013   Procedure: RIGHT FOOT CHEVRON OSTEOTOMY AND 2ND TOE RESECTION  ARTHOPLASTY with pin fixation;  Surgeon: Hessie Dibble, MD;  Location: [facility];  Service: Orthopedics;  Laterality: Right;  . TUBAL LIGATION    . WISDOM TOOTH EXTRACTION     Social History   Occupational History  . Not  on file  Tobacco Use  . Smoking status: Never Smoker  . Smokeless tobacco: Never Used  Substance and Sexual Activity  . Alcohol use: Yes    Alcohol/week: 0.0 oz    Comment: Occas.  . Drug use: No  . Sexual activity: Never    Birth control/protection: Post-menopausal, Surgical    Comment: 1st intercourse 67 yo-Fewer than 5 partners--BTL

## 2017-12-22 ENCOUNTER — Ambulatory Visit (INDEPENDENT_AMBULATORY_CARE_PROVIDER_SITE_OTHER): Payer: 59 | Admitting: Physician Assistant

## 2017-12-22 ENCOUNTER — Encounter (INDEPENDENT_AMBULATORY_CARE_PROVIDER_SITE_OTHER): Payer: Self-pay | Admitting: Physician Assistant

## 2017-12-22 DIAGNOSIS — M1612 Unilateral primary osteoarthritis, left hip: Secondary | ICD-10-CM | POA: Diagnosis not present

## 2017-12-22 DIAGNOSIS — M7541 Impingement syndrome of right shoulder: Secondary | ICD-10-CM | POA: Diagnosis not present

## 2017-12-22 NOTE — Progress Notes (Signed)
Office Visit Note   Patient: Terri [name]           Date of Birth: [birth year]           MRN: [id number] Visit Date: 12/22/2017              Requested by: No referring provider defined for this encounter. PCP: Patient, No Pcp Per   Assessment & Plan: Visit Diagnoses:  1. Primary osteoarthritis of left hip   2. Shoulder impingement syndrome, right     Plan: She will continue with her home exercise program for the shoulder.  Regards to the hip she undergo intra-articular injection of her left hip this coming Wednesday with Dr. Ernestina Patches.  She will follow-up with [place] in about 2 months to see how she is doing in regards to the hip mainly.  She relates now that she would like to proceed with surgery sometime in the fall for a left total hip arthroplasty. Offered  radiographs of the left ribs she defers.  She states she is having no shortness of breath and there is nothing really to do for the ribs she rather not take the x-rays.  Told her to no contact sports until the pain is resolved.  Follow-Up Instructions: Return in about 2 months (around 02/21/2018).   Orders:  No orders of the defined types were placed in this encounter.  No orders of the defined types were placed in this encounter.     Procedures: No procedures performed   Clinical Data: No additional findings.   Subjective: Chief Complaint  Patient presents with  . Right Shoulder - Follow-up    S/p cortisone injection 12/08/17  . Left Hip - Follow-up    HPI Terri [name] returns today due to left hip pain and right shoulder pain.  She states that her right shoulder pain is improved since the cortisone injections is been doing home exercise program.  She still has some soreness in the arm.  Regards to her left hip states left hip overall remains the same she is set up for a intra-articular injection left hip by Dr. Ernestina Patches on Wednesday.  She is thinking she may want to proceed with surgery sooner on her left hip.   Past office visit she wanted to   Hold off on surgery as long as she could.  But after going home to think about it she would like to proceed with surgery sometime in fall. She also mentions that she had slipped while mopping floor on 12/18/2017 and hit her left ribs on the arm chair at home.  She had no shortness of breath chest pain shortness of breath.  She continues to have pain in the anterior and anterior lateral aspect of the left lower denies any shortness of breath rib cage.  Review of Systems Denies any shortness of breath, orthopnea fevers, chills, nausea or vomiting.  Objective: Vital Signs: There were no vitals taken for this visit.  Physical Exam  Constitutional: She is oriented to person, place, and time. She appears well-developed and well-nourished. No distress.  Pulmonary/Chest: Effort normal.  Neurological: She is alert and oriented to person, place, and time.  Skin: She is not diaphoretic.  Psychiatric: She has a normal mood and affect.    Ortho Exam  Bilateral shoulders 5 out of 5 strength against resistance with external and internal rotation.  Negative impingement right shoulder.  Negative liftoff right shoulder.  Full forward flexion of the right shoulder pain. Right anterior and  lateral lower rib pain with palpation.  No visible respiratory distress.  Specialty Comments:  No specialty comments available.  Imaging: No results found.   PMFS History: There are no active problems to display for this patient.  Past Medical History:  Diagnosis Date  . Asthma    hx no meds  . Contact lens/glasses fitting    wears contacts or glasses  . Osteopenia 12/2016   T score -2.3 FRAX 20%  . Shingles     Family History  Problem Relation Age of Onset  . Heart disease Mother   . Lung cancer Mother   . Hypertension Mother   . Hyperlipidemia Mother   . Hypertension Father   . Stroke Father   . Hyperlipidemia Father   . Breast cancer Maternal Aunt 52  . Breast  cancer Paternal Aunt 19  . Lung cancer Maternal Grandfather   . Aneurysm Paternal Grandmother   . Lung cancer Paternal Grandfather   . Aneurysm Cousin        4 Maternal 1st cousins    Past Surgical History:  Procedure Laterality Date  . COLONOSCOPY    . METATARSAL OSTEOTOMY WITH BUNIONECTOMY Right 09/19/2013   Procedure: RIGHT FOOT CHEVRON OSTEOTOMY AND 2ND TOE RESECTION  ARTHOPLASTY with pin fixation;  Surgeon: Hessie Dibble, MD;  Location: [facility];  Service: Orthopedics;  Laterality: Right;  . TUBAL LIGATION    . WISDOM TOOTH EXTRACTION     Social History   Occupational History  . Not on file  Tobacco Use  . Smoking status: Never Smoker  . Smokeless tobacco: Never Used  Substance and Sexual Activity  . Alcohol use: Yes    Alcohol/week: 0.0 oz    Comment: Occas.  . Drug use: No  . Sexual activity: Never    Birth control/protection: Post-menopausal, Surgical    Comment: 1st intercourse 67 yo-Fewer than 5 partners--BTL

## 2017-12-28 ENCOUNTER — Ambulatory Visit (INDEPENDENT_AMBULATORY_CARE_PROVIDER_SITE_OTHER): Payer: Self-pay | Admitting: Physical Medicine and Rehabilitation

## 2018-02-21 ENCOUNTER — Ambulatory Visit (INDEPENDENT_AMBULATORY_CARE_PROVIDER_SITE_OTHER): Payer: 59 | Admitting: Physician Assistant

## 2018-05-11 DIAGNOSIS — M25552 Pain in left hip: Secondary | ICD-10-CM | POA: Diagnosis not present

## 2018-05-11 DIAGNOSIS — M1612 Unilateral primary osteoarthritis, left hip: Secondary | ICD-10-CM | POA: Diagnosis not present

## 2018-05-11 MED FILL — traMADol HCL 50 MG TABS: 50 | 8 days supply | Qty: 30 | Fill #0

## 2018-06-06 ENCOUNTER — Other Ambulatory Visit: Payer: Self-pay | Admitting: Gynecology

## 2018-06-06 DIAGNOSIS — Z1231 Encounter for screening mammogram for malignant neoplasm of breast: Secondary | ICD-10-CM

## 2018-07-19 ENCOUNTER — Ambulatory Visit
Admission: RE | Admit: 2018-07-19 | Discharge: 2018-07-19 | Disposition: A | Discharge status: Home / Self Care | Payer: 59 | Source: Ambulatory Visit | Attending: Gynecology | Admitting: Gynecology

## 2018-07-19 DIAGNOSIS — Z1231 Encounter for screening mammogram for malignant neoplasm of breast: Secondary | ICD-10-CM | POA: Diagnosis not present

## 2018-07-20 ENCOUNTER — Encounter: Payer: Self-pay | Admitting: Gynecology

## 2018-07-20 ENCOUNTER — Ambulatory Visit (INDEPENDENT_AMBULATORY_CARE_PROVIDER_SITE_OTHER): Payer: 59 | Admitting: Gynecology

## 2018-07-20 VITALS — BP 118/78 | Ht 62.0 in | Wt 159.0 lb

## 2018-07-20 DIAGNOSIS — N952 Postmenopausal atrophic vaginitis: Secondary | ICD-10-CM | POA: Diagnosis not present

## 2018-07-20 DIAGNOSIS — Z13220 Encounter for screening for lipoid disorders: Secondary | ICD-10-CM

## 2018-07-20 DIAGNOSIS — Z01419 Encounter for gynecological examination (general) (routine) without abnormal findings: Secondary | ICD-10-CM | POA: Diagnosis not present

## 2018-07-20 DIAGNOSIS — M8580 Other specified disorders of bone density and structure, unspecified site: Secondary | ICD-10-CM

## 2018-07-20 NOTE — Progress Notes (Signed)
    Terri [name] [birth year] [id number]        67 y.o.  [id number] for annual gynecologic exam.  Without gynecologic complaints  Past medical history,surgical history, problem list, medications, allergies, family history and social history were all reviewed and documented as reviewed in the EPIC chart.  ROS:  Performed with pertinent positives and negatives included in the history, assessment and plan.   Additional significant findings : None   Exam: Caryn Bee assistant Vitals:   07/20/18 1609  BP: 118/78  Weight: 159 lb (72.1 kg)  Height: 5\' 2"  (1.575 m)   Body mass index is 29.08 kg/m.  General appearance:  Normal affect, orientation and appearance. Skin: Grossly normal HEENT: Without gross lesions.  No cervical or supraclavicular adenopathy. Thyroid normal.  Lungs:  Clear without wheezing, rales or rhonchi Cardiac: RR, without RMG Abdominal:  Soft, nontender, without masses, guarding, rebound, organomegaly or hernia Breasts:  Examined lying and sitting without masses, retractions, discharge or axillary adenopathy. Pelvic:  Ext, BUS, Vagina: With atrophic changes.  First to second-degree rectocele.  No cystocele.  No uterine prolapse  Cervix: With atrophic changes.  Pap smear done  Uterus: Anteverted, normal size, shape and contour, midline and mobile nontender   Adnexa: Without masses or tenderness    Anus and perineum: Normal   Rectovaginal: Normal sphincter tone without palpated masses or tenderness.    Assessment/Plan:  67 y.o. [id number] female for annual gynecologic exam.   1. Postmenopausal.  No significant menopausal symptoms.  No vaginal bleeding. 2. Rectocele.  Stable over serial exams.  Without significant symptoms at this time. 3. Osteopenia.  DEXA 2018 T score -2.3 FRAX 20% / 1.5%.  Discussed 20% overall fracture risk as indication to discuss possible medication.  At this point the patient is not interested in taking medication.  Is active and walks at work.   Will check baseline vitamin D.  We will plan on repeat DEXA next year at 2-year interval. 4. Mammography yesterday.  Breast exam normal today. 5. Colonoscopy 2009.  Due for repeat and patient will arrange. 6. Pap smear 10/2015.  Pap smear done today.  No history of abnormal Pap smears previously. 7. Health maintenance.  Future orders placed for fasting CBC, CMP, lipid profile, TSH and vitamin D.  Follow-up in 1 year, sooner as needed.   Anastasio Auerbach MD, 4:51 PM 07/20/2018

## 2018-07-20 NOTE — Patient Instructions (Signed)
Follow-up for fasting blood work as arranged

## 2018-07-21 ENCOUNTER — Encounter: Payer: Self-pay | Admitting: Gynecology

## 2018-07-21 MED ORDER — ALPRAZOLAM 0.25 MG PO TABS: 0.2500 mg | ORAL_TABLET | Freq: Every evening | ORAL | 3 refills | Status: DC | PRN

## 2018-07-21 MED FILL — ALPRAZolam 0.25 MG TABS: 0.25 | 30 days supply | Qty: 30 | Fill #0

## 2018-07-21 NOTE — Telephone Encounter (Signed)
Okay for trial of Xanax 0.25 mg at bedtime as needed #30 with 3 refills.  Tell patient I do not want to see her using this nightly but as a intermittent as needed then okay

## 2018-07-22 LAB — PAP IG W/ RFLX HPV ASCU

## 2018-08-05 MED FILL — traMADol HCL 50 MG TABS: 50 | 10 days supply | Qty: 30 | Fill #0

## 2018-10-25 ENCOUNTER — Telehealth: Payer: 59 | Admitting: Family

## 2018-10-25 DIAGNOSIS — B373 Candidiasis of vulva and vagina: Secondary | ICD-10-CM | POA: Diagnosis not present

## 2018-10-25 DIAGNOSIS — B3731 Acute candidiasis of vulva and vagina: Secondary | ICD-10-CM

## 2018-10-25 MED ORDER — FLUCONAZOLE 150 MG PO TABS: 150.0000 mg | ORAL_TABLET | Freq: Once | ORAL | 0 refills | Status: AC

## 2018-10-25 NOTE — Progress Notes (Signed)
We are sorry that you are not feeling well. Here is how we plan to help! Based on what you shared with me it looks like you: May have a yeast vaginosis  Vaginosis is an inflammation of the vagina that can result in discharge, itching and pain. The cause is usually a change in the normal balance of vaginal bacteria or an infection. Vaginosis can also result from reduced estrogen levels after menopause.  The most common causes of vaginosis are:   Bacterial vaginosis which results from an overgrowth of one on several organisms that are normally present in your vagina.   Yeast infections which are caused by a naturally occurring fungus called candida.   Vaginal atrophy (atrophic vaginosis) which results from the thinning of the vagina from reduced estrogen levels after menopause.   Trichomoniasis which is caused by a parasite and is commonly transmitted by sexual intercourse.  Factors that increase your risk of developing vaginosis include: Marland Kitchen Medications, such as antibiotics and steroids . Uncontrolled diabetes . Use of hygiene products such as bubble bath, vaginal spray or vaginal deodorant . Douching . Wearing damp or tight-fitting clothing . Using an intrauterine device (IUD) for birth control . Hormonal changes, such as those associated with pregnancy, birth control pills or menopause . Sexual activity . Having a sexually transmitted infection  Your treatment plan is Monistat (miconazole) or Gyne-Lotrimin (clotrimazole) over the counter at most phamacies. You can apply this externally. Additionally, I have sent in Diflucan 150 mg to be taken as a single dose  Be sure to take all of the medication as directed. Stop taking any medication if you develop a rash, tongue swelling or shortness of breath. Mothers who are breast feeding should consider pumping and discarding their breast milk while on these antibiotics. However, there is no consensus that infant exposure at these doses would be  harmful.  Remember that medication creams can weaken latex condoms. Marland Kitchen   HOME CARE:  Good hygiene may prevent some types of vaginosis from recurring and may relieve some symptoms:  . Avoid baths, hot tubs and whirlpool spas. Rinse soap from your outer genital area after a shower, and dry the area well to prevent irritation. Don't use scented or harsh soaps, such as those with deodorant or antibacterial action. Marland Kitchen Avoid irritants. These include scented tampons and pads. . Wipe from front to back after using the toilet. Doing so avoids spreading fecal bacteria to your vagina.  Other things that may help prevent vaginosis include:  Marland Kitchen Don't douche. Your vagina doesn't require cleansing other than normal bathing. Repetitive douching disrupts the normal organisms that reside in the vagina and can actually increase your risk of vaginal infection. Douching won't clear up a vaginal infection. . Use a latex condom. Both female and female latex condoms may help you avoid infections spread by sexual contact. . Wear cotton underwear. Also wear pantyhose with a cotton crotch. If you feel comfortable without it, skip wearing underwear to bed. Yeast thrives in [employer] Your symptoms should improve in the next day or two.  GET HELP RIGHT AWAY IF:  . You have pain in your lower abdomen ( pelvic area or over your ovaries) . You develop nausea or vomiting . You develop a fever . Your discharge changes or worsens . You have persistent pain with intercourse . You develop shortness of breath, a rapid pulse, or you faint.  These symptoms could be signs of problems or infections that need to be evaluated by  a medical provider now.  MAKE SURE YOU    Understand these instructions.  Will watch your condition.  Will get help right away if you are not doing well or get worse.  Your e-visit answers were reviewed by a board certified advanced clinical practitioner to complete your personal care plan.  Depending upon the condition, your plan could have included both over the counter or prescription medications. Please review your pharmacy choice to make sure that you have choses a pharmacy that is open for you to pick up any needed prescription, Your safety is important to [place]. If you have drug allergies check your prescription carefully.   You can use MyChart to ask questions about today's visit, request a non-urgent call back, or ask for a work or school excuse for 24 hours related to this e-Visit. If it has been greater than 24 hours you will need to follow up with your provider, or enter a new e-Visit to address those concerns. You will get a MyChart message within the next two days asking about your experience. I hope that your e-visit has been valuable and will speed your recovery.

## 2019-02-08 DIAGNOSIS — M1612 Unilateral primary osteoarthritis, left hip: Secondary | ICD-10-CM | POA: Diagnosis not present

## 2019-02-09 MED FILL — traMADol HCL 50 MG TABS: 50 | 10 days supply | Qty: 30 | Fill #0

## 2019-05-10 ENCOUNTER — Encounter: Payer: Self-pay | Admitting: Gynecology

## 2019-05-24 MED FILL — traMADol HCL 50 MG TABS: 50 | 10 days supply | Qty: 30 | Fill #0

## 2019-05-30 ENCOUNTER — Ambulatory Visit (INDEPENDENT_AMBULATORY_CARE_PROVIDER_SITE_OTHER): Payer: 59 | Admitting: Internal Medicine

## 2019-05-30 ENCOUNTER — Other Ambulatory Visit: Payer: Self-pay

## 2019-05-30 ENCOUNTER — Encounter (INDEPENDENT_AMBULATORY_CARE_PROVIDER_SITE_OTHER): Payer: Self-pay | Admitting: Internal Medicine

## 2019-05-30 VITALS — BP 150/70 | HR 72 | Ht 61.5 in | Wt 158.0 lb

## 2019-05-30 DIAGNOSIS — Z1159 Encounter for screening for other viral diseases: Secondary | ICD-10-CM | POA: Diagnosis not present

## 2019-05-30 DIAGNOSIS — Z131 Encounter for screening for diabetes mellitus: Secondary | ICD-10-CM

## 2019-05-30 DIAGNOSIS — M1990 Unspecified osteoarthritis, unspecified site: Secondary | ICD-10-CM

## 2019-05-30 DIAGNOSIS — E559 Vitamin D deficiency, unspecified: Secondary | ICD-10-CM | POA: Diagnosis not present

## 2019-05-30 DIAGNOSIS — E782 Mixed hyperlipidemia: Secondary | ICD-10-CM | POA: Diagnosis not present

## 2019-05-30 DIAGNOSIS — R232 Flushing: Secondary | ICD-10-CM | POA: Diagnosis not present

## 2019-05-30 DIAGNOSIS — F5101 Primary insomnia: Secondary | ICD-10-CM

## 2019-05-30 DIAGNOSIS — M1612 Unilateral primary osteoarthritis, left hip: Secondary | ICD-10-CM | POA: Diagnosis not present

## 2019-05-30 DIAGNOSIS — J45909 Unspecified asthma, uncomplicated: Secondary | ICD-10-CM | POA: Insufficient documentation

## 2019-05-30 DIAGNOSIS — Z0001 Encounter for general adult medical examination with abnormal findings: Secondary | ICD-10-CM | POA: Diagnosis not present

## 2019-05-30 DIAGNOSIS — M8588 Other specified disorders of bone density and structure, other site: Secondary | ICD-10-CM | POA: Diagnosis not present

## 2019-05-30 DIAGNOSIS — J4520 Mild intermittent asthma, uncomplicated: Secondary | ICD-10-CM | POA: Diagnosis not present

## 2019-05-30 DIAGNOSIS — M8580 Other specified disorders of bone density and structure, unspecified site: Secondary | ICD-10-CM | POA: Insufficient documentation

## 2019-05-30 DIAGNOSIS — E785 Hyperlipidemia, unspecified: Secondary | ICD-10-CM

## 2019-05-30 HISTORY — DX: Unspecified osteoarthritis, unspecified site: M19.90

## 2019-05-30 HISTORY — DX: Hyperlipidemia, unspecified: E78.5

## 2019-05-30 NOTE — Progress Notes (Addendum)
Chief Complaint: This pleasant 68 year old lady comes in as a new patient to be established, have an annual physical exam and to address her symptoms that she is concerned about which are described below. HPI: Her main issue seems to be 1 of insomnia.  She has difficulty falling asleep and sometimes wakes up in the middle of the night.  She has the symptoms for several years apparently. She also describes hot flashes which is still persistent despite her being menopausal at the age of about 63-53. She has mild asthma for which she does not take any regular medications. She is known to have osteopenia but not osteoporosis yet. She also does have left hip osteoarthritis and has been recommended to have a total hip replacement in the future and she is certainly considering this. She would like to lose weight and become healthier. Historically, she does have a history of mild hyperlipidemia which has not required statin therapy.  She has no history of coronary artery disease or cerebrovascular disease.  She denies any chest pain, dyspnea, palpitations or limb weakness.  Past Medical History:  Diagnosis Date  . Asthma    hx no meds  . Contact lens/glasses fitting    wears contacts or glasses  . HLD (hyperlipidemia) 05/30/2019  . Osteoarthritis 05/30/2019  . Osteopenia 12/2016   T score -2.3 FRAX 20%  . Shingles    Past Surgical History:  Procedure Laterality Date  . COLONOSCOPY    . METATARSAL OSTEOTOMY WITH BUNIONECTOMY Right 09/19/2013   Procedure: RIGHT FOOT CHEVRON OSTEOTOMY AND 2ND TOE RESECTION  ARTHOPLASTY with pin fixation;  Surgeon: Hessie Dibble, MD;  Location: [facility];  Service: Orthopedics;  Laterality: Right;  . TUBAL LIGATION    . 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        Allergies:  Allergies   Allergen Reactions  . Aspirin Swelling  . Penicillins Other (See Comments)    Numbness     No outpatient medications have been marked as taking for the 05/30/19 encounter (Office Visit) with Doree Albee, MD.      [id number] from the symptoms mentioned above,there are no other symptoms referable to all systems reviewed.  Physical Exam: Blood pressure (!) 150/70, pulse 72, height 5' 1.5" (1.562 m), weight 158 lb (71.7 kg). Vitals with BMI 05/30/2019 07/20/2018 11/02/2016  Height 5' 1.5" 5\' 2"  5\' 1"   Weight 158 lbs 159 lbs 164 lbs  BMI 29.37 [id number] [id number]  Systolic Q000111Q [id number]  Diastolic 70 78 80  Pulse 72 - -      She looks systemically well, she is overweight.  Blood pressure today systolic was elevated and we will monitor this on the next visit. General: Alert, cooperative, and appears to be the stated age.No pallor.  No jaundice.  No clubbing. Head: Normocephalic Eyes: Sclera white, pupils equal and reactive to light, red reflex x 2,  Ears: Normal bilaterally Oral cavity: Lips, mucosa, and tongue normal: Teeth and gums normal Neck: No adenopathy, supple, symmetrical, trachea midline, and thyroid does not appear enlarged. Breast: No masses felt. Respiratory: Clear to auscultation bilaterally.No wheezing, crackles or bronchial breathing. Cardiovascular: Heart sounds are present and appear to be normal without murmurs or added sounds.  No carotid bruits.  Peripheral pulses are present and equal bilaterally.: Gastrointestinal:positive bowel sounds, no hepatosplenomegaly.  No masses felt.No tenderness. Skin: Clear, No rashes noted.No worrisome skin lesions seen. Neurological: Grossly intact without focal findings, cranial nerves II through XII intact, muscle strength equal bilaterally Musculoskeletal: No acute joint abnormalities noted.Full range of movement noted with joints.  She has osteoarthritic hands. Psychiatric: Affect appropriate, non-anxious.    Assessment  1.  Encounter for general adult medical examination with abnormal findings   2. Mixed hyperlipidemia   3. Osteopenia of spine   4. Primary osteoarthritis of left hip   5. Mild intermittent asthma without complication   6. Primary insomnia   7. Hot flashes   8. Vitamin D deficiency disease   9. Encounter for hepatitis C screening test for low risk patient   10. Screening for diabetes mellitus     Tests Ordered:   Orders Placed This Encounter  Procedures  . CBC  . COMPLETE METABOLIC PANEL WITH GFR  . Hemoglobin A1c  . T3, free  . T4  . Hepatitis C antibody  . Hemoglobin A1c  . TSH  . Lipid panel     Plan  1. Blood work is ordered as above. 2. She was given Prevnar 13 vaccination today. 3. I have told her that insomnia may be related to menopausal state and we will address this on the next visit.  We will draw also address nutrition and discuss her results in more detail.  She is known to have vitamin D deficiency and she will need supplementation also. 4. Further recommendations will depend on blood results and I will see her for follow-up in the next 2 to 3 weeks time.     No orders of the defined types were placed in this encounter.    Brisha Mccabe C Damarian Priola   05/30/2019, 3:06 PM

## 2019-05-31 LAB — COMPLETE METABOLIC PANEL WITH GFR
AG Ratio: 1.3 (calc) (ref 1.0–2.5)
ALT: 12 U/L (ref 6–29)
AST: 15 U/L (ref 10–35)
Albumin: 4.3 g/dL (ref 3.6–5.1)
Alkaline phosphatase (APISO): 99 U/L (ref 37–153)
BUN: 12 mg/dL (ref 7–25)
CO2: 22 mmol/L (ref 20–32)
Calcium: 9.7 mg/dL (ref 8.6–10.4)
Chloride: 103 mmol/L (ref 98–110)
Creat: 0.81 mg/dL (ref 0.50–0.99)
GFR, Est African American: 87 mL/min/{1.73_m2} (ref >=60)
GFR, Est Non African American: 75 mL/min/{1.73_m2} (ref >=60)
Globulin: 3.2 g/dL (calc) (ref 1.9–3.7)
Glucose, Bld: 102 mg/dL — ABNORMAL HIGH (ref 65–99)
Potassium: 4.2 mmol/L (ref 3.5–5.3)
Sodium: 140 mmol/L (ref 135–146)
Total Bilirubin: 0.4 mg/dL (ref 0.2–1.2)
Total Protein: 7.5 g/dL (ref 6.1–8.1)

## 2019-05-31 LAB — LIPID PANEL
Cholesterol: 211 mg/dL — ABNORMAL HIGH (ref <200)
HDL: 69 mg/dL (ref >=50)
LDL Cholesterol (Calc): 126 mg/dL (calc) — ABNORMAL HIGH
Non-HDL Cholesterol (Calc): 142 mg/dL (calc) — ABNORMAL HIGH (ref <130)
Total CHOL/HDL Ratio: 3.1 (calc) (ref <5.0)
Triglycerides: 72 mg/dL (ref <150)

## 2019-05-31 LAB — TSH: TSH: 3.79 mIU/L (ref 0.40–4.50)

## 2019-05-31 LAB — CBC
HCT: 40.8 % (ref 35.0–45.0)
Hemoglobin: 13.6 g/dL (ref 11.7–15.5)
MCH: 29.3 pg (ref 27.0–33.0)
MCHC: 33.3 g/dL (ref 32.0–36.0)
MCV: 87.9 fL (ref 80.0–100.0)
MPV: 11.1 fL (ref 7.5–12.5)
Platelets: 288 10*3/uL (ref 140–400)
RBC: 4.64 10*6/uL (ref 3.80–5.10)
RDW: 15.2 % — ABNORMAL HIGH (ref 11.0–15.0)
WBC: 6.3 10*3/uL (ref 3.8–10.8)

## 2019-05-31 LAB — HEPATITIS C ANTIBODY
Hepatitis C Ab: NONREACTIVE
SIGNAL TO CUT-OFF: 0.01 (ref <1.00)

## 2019-05-31 LAB — HEMOGLOBIN A1C
Hgb A1c MFr Bld: 5.6 % of total Hgb (ref <5.7)
Mean Plasma Glucose: 114 (calc)
eAG (mmol/L): 6.3 (calc)

## 2019-05-31 LAB — T3, FREE: T3, Free: 3.8 pg/mL (ref 2.3–4.2)

## 2019-05-31 LAB — T4: T4, Total: 9.6 ug/dL (ref 5.1–11.9)

## 2019-06-06 ENCOUNTER — Other Ambulatory Visit: Payer: Self-pay | Admitting: Gynecology

## 2019-06-06 DIAGNOSIS — Z1231 Encounter for screening mammogram for malignant neoplasm of breast: Secondary | ICD-10-CM

## 2019-06-20 ENCOUNTER — Ambulatory Visit (INDEPENDENT_AMBULATORY_CARE_PROVIDER_SITE_OTHER): Payer: 59 | Admitting: Internal Medicine

## 2019-06-20 ENCOUNTER — Encounter (INDEPENDENT_AMBULATORY_CARE_PROVIDER_SITE_OTHER): Payer: Self-pay | Admitting: Internal Medicine

## 2019-06-20 ENCOUNTER — Other Ambulatory Visit: Payer: Self-pay

## 2019-06-20 VITALS — BP 126/78 | HR 64 | Ht 61.5 in | Wt 160.0 lb

## 2019-06-20 DIAGNOSIS — F5101 Primary insomnia: Secondary | ICD-10-CM

## 2019-06-20 DIAGNOSIS — E559 Vitamin D deficiency, unspecified: Secondary | ICD-10-CM

## 2019-06-20 DIAGNOSIS — R232 Flushing: Secondary | ICD-10-CM

## 2019-06-20 DIAGNOSIS — E782 Mixed hyperlipidemia: Secondary | ICD-10-CM

## 2019-06-20 DIAGNOSIS — M8588 Other specified disorders of bone density and structure, other site: Secondary | ICD-10-CM

## 2019-06-20 MED ORDER — PROGESTERONE MICRONIZED 200 MG PO CAPS: 200.0000 mg | ORAL_CAPSULE | Freq: Every evening | ORAL | 3 refills | Status: DC

## 2019-06-20 MED ORDER — ESTRADIOL 0.5 MG PO TABS: 0.5000 mg | ORAL_TABLET | Freq: Every day | ORAL | 3 refills | Status: DC

## 2019-06-20 MED FILL — ESTRADIOL 0.5 MG TABS: 0.5 | 30 days supply | Qty: 30 | Fill #0

## 2019-06-20 MED FILL — PROGESTERONE 200 MG CAPSULE: 200 | 30 days supply | Qty: 30 | Fill #0

## 2019-06-20 NOTE — Patient Instructions (Signed)
[name] Dietary Recommendations for Weight Loss What to Avoid . Avoid added sugars o Often added sugar can be found in processed foods such as many condiments, dry cereals, cakes, cookies, chips, crisps, crackers, candies, sweetened drinks, etc.  o Read labels and AVOID/DECREASE use of foods with the following in their ingredient list: Sugar, fructose, high fructose corn syrup, sucrose, glucose, maltose, dextrose, molasses, cane sugar, brown sugar, any type of syrup, agave nectar, etc.   . Avoid snacking in between meals . Avoid foods made with flour o If you are going to eat food made with flour, choose those made with whole-grains; and, minimize your consumption as much as is tolerable . Avoid processed foods o These foods are generally stocked in the middle of the grocery store. Focus on shopping on the perimeter of the grocery.  What to Include . Vegetables o GREEN LEAFY VEGETABLES: Kale, spinach, mustard greens, collard greens, cabbage, broccoli, etc. o OTHER: Asparagus, cauliflower, eggplant, carrots, peas, Brussel sprouts, tomatoes, bell peppers, zucchini, beets, cucumbers, etc. . Grains, seeds, and legumes o Beans: kidney beans, black eyed peas, garbanzo beans, black beans, pinto beans, etc. o Whole, unrefined grains: brown rice, barley, bulgur, oatmeal, etc. . Healthy fats  o Avoid highly processed fats such as vegetable oil o Examples of healthy fats: avocado, olives, virgin olive oil, dark chocolate (?72% Cocoa), nuts (peanuts, almonds, walnuts, cashews, pecans, etc.) . Low - Moderate Intake of Animal Sources of Protein o Meat sources: chicken, turkey, salmon, tuna. Limit to 4 ounces of meat at one time. o Consider limiting dairy sources, but when choosing dairy focus on: PLAIN Greek yogurt, cottage cheese, high-protein milk . Fruit o Choose berries  When to Eat . Intermittent Fasting: o Choosing not to eat for a specific time period, but DO FOCUS ON HYDRATION  when fasting o Multiple Techniques: - Time Restricted Eating: eat 3 meals in a day, each meal lasting no more than 60 minutes, no snacks between meals - 16-18 hour fast: fast for 16 to 18 hours up to 7 days a week. Often suggested to start with 2-3 nonconsecutive days per week.  . Remember the time you sleep is counted as fasting.  . Examples of eating schedule: Fast from 7:00pm-11:00am. Eat between 11:00am-7:00pm.  - 24-hour fast: fast for 24 hours up to every other day. Often suggested to start with 1 day per week . Remember the time you sleep is counted as fasting . Examples of eating schedule:  o Eating day: eat 2-3 meals on your eating day. If doing 2 meals, each meal should last no more than 90 minutes. If doing 3 meals, each meal should last no more than 60 minutes. Finish last meal by 7:00pm. o Fasting day: Fast until 7:00pm.  o IF YOU FEEL UNWELL FOR ANY REASON/IN ANY WAY WHEN FASTING, STOP FASTING BY EATING A NUTRITIOUS SNACK OR LIGHT MEAL o ALWAYS FOCUS ON HYDRATION DURING FASTS - Acceptable Hydration sources: water, broths, tea/coffee (black tea/coffee is best but using a small amount of whole-fat dairy products in coffee/tea is acceptable).  - Poor Hydration Sources: anything with sugar or artificial sweeteners added to it  These recommendations have been developed for patients that are actively receiving medical care from either Dr. Sian Joles or Sarah Gray, DNP, NP-C at [facility]. These recommendations are developed for patients with specific medical conditions and are not meant to be distributed or used by others that are not actively receiving care from either provider listed   above at [name]. It is not appropriate to participate in the above eating plans without proper medical supervision.   Reference: Fung, J. The obesity code. [place]: [place]; 2016.   

## 2019-06-20 NOTE — Progress Notes (Signed)
Metrics: Intervention Frequency ACO  Documented Smoking Status Yearly  Screened one or more times in 24 months  Cessation Counseling or  Active cessation medication Past 24 months  Past 24 months   Guideline developer: UpToDate (See UpToDate for funding source) Date Released: 2014       Wellness Office Visit  Subjective:  Patient ID: Terri [name], female    DOB: 09/01/1950  Age: 68 y.o. MRN: [id number]  CC: This lady comes in for follow-up of her recent visit and follow-up on her blood work and address her symptoms that she described previously of insomnia and hot flashes. HPI  I reviewed all her blood work with her.  She has hyperlipidemia but this does not require statin therapy.  Remaining blood work is essentially unremarkable.  Unfortunately, for some reason, vitamin D levels were not done but when I looked back, 2 years ago, her vitamin D levels were extremely low. She continues to have symptoms of insomnia and hot flashes. She also described to me significantly decreased libido which has been present for the last 10 years or so. Her partner also has low testosterone levels and so sex is somewhat of a difficult endeavor for both of them. Past Medical History:  Diagnosis Date  . Asthma    hx no meds  . Contact lens/glasses fitting    wears contacts or glasses  . HLD (hyperlipidemia) 05/30/2019  . Osteoarthritis 05/30/2019  . Osteopenia 12/2016   T score -2.3 FRAX 20%  . Shingles       Family History  Problem Relation Age of Onset  . Heart disease Mother   . Lung cancer Mother   . Hypertension Mother   . Hyperlipidemia Mother   . Hypertension Father   . Stroke Father   . Hyperlipidemia Father   . Breast cancer Maternal Aunt 52  . Breast cancer Paternal Aunt 71  . Lung cancer Maternal Grandfather   . Aneurysm Paternal Grandmother   . Lung cancer Paternal Grandfather   . 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   Social History   Tobacco Use  . Smoking status: Never Smoker  . Smokeless tobacco: Never Used  Substance Use Topics  . Alcohol use: Yes    Alcohol/week: 0.0 standard drinks    Comment: Occas.    No outpatient medications have been marked as taking for the 06/20/19 encounter (Office Visit) with Doree Albee, MD.      Objective:   Today's Vitals: BP 126/78   Pulse 64   Ht 5' 1.5" (1.562 m)   Wt 160 lb (72.6 kg)   BMI 29.74 kg/m  Vitals with BMI 06/20/2019 05/30/2019 07/20/2018  Height 5' 1.5" 5' 1.5" 5\' 2"   Weight 160 lbs 158 lbs 159 lbs  BMI 29.75 0000000 [id number]  Systolic [id number] Q000111Q 123456  Diastolic 78 70 78  Pulse 64 72 -     Physical Exam    She looks systemically well.  No new physical findings.   Assessment   1. Osteopenia of spine   2. Hot flashes   3. Primary insomnia   4. Mixed hyperlipidemia   5. Vitamin D deficiency disease       Tests ordered No orders of the defined types were placed in this encounter.    Plan: 1.  I discussed nutrition and the concept of intermittent fasting combined with a low carbohydrate, moderate protein and higher fat diet as well as whole food plant-based diet.  I gave her a diet handout regarding this. 2.   I discussed her symptoms of insomnia and hot flashes and I think she may well benefit from bioidentical hormone therapy.  I discussed the studies regarding bioidentical hormones and also the women's health initiative study using synthetic hormones and I discussed the difference between the 2. 3.I am going to start her on estradiol and progesterone and she is agreeable to this, I described possible side effects.  I also described benefits. 4.  I recommended she start taking vitamin D3 10,000 units daily. 5.  I will see her in about 4 to 5 weeks to see how she is doing and we will check hormone levels  again and see how her symptoms have improved or not.  At a later time, we may need to discuss also testosterone therapy which I think will also be beneficial for this lady. 6.  Today I spent 45 to 50 minutes with this patient face-to-face, more than 50% of the time was involved in discussing nutrition and bioidentical hormone therapy.  Meds ordered this encounter  Medications  . progesterone (PROMETRIUM) 200 MG capsule    Sig: Take 1 capsule (200 mg total) by mouth every evening.    Dispense:  30 capsule    Refill:  3  . estradiol (ESTRACE) 0.5 MG tablet    Sig: Take 1 tablet (0.5 mg total) by mouth daily.    Dispense:  30 tablet    Refill:  3    Henrick Mcgue Luther Parody, MD

## 2019-07-25 ENCOUNTER — Encounter: Payer: Self-pay | Admitting: Gynecology

## 2019-07-25 ENCOUNTER — Other Ambulatory Visit: Payer: Self-pay

## 2019-07-25 ENCOUNTER — Ambulatory Visit (INDEPENDENT_AMBULATORY_CARE_PROVIDER_SITE_OTHER): Payer: 59 | Admitting: Gynecology

## 2019-07-25 VITALS — BP 124/76 | Ht 61.0 in | Wt 155.0 lb

## 2019-07-25 DIAGNOSIS — M8580 Other specified disorders of bone density and structure, unspecified site: Secondary | ICD-10-CM

## 2019-07-25 DIAGNOSIS — N952 Postmenopausal atrophic vaginitis: Secondary | ICD-10-CM | POA: Diagnosis not present

## 2019-07-25 DIAGNOSIS — Z01419 Encounter for gynecological examination (general) (routine) without abnormal findings: Secondary | ICD-10-CM

## 2019-07-25 DIAGNOSIS — N816 Rectocele: Secondary | ICD-10-CM | POA: Diagnosis not present

## 2019-07-25 MED FILL — ESTRADIOL 0.5 MG TABS: 0.5 | 30 days supply | Qty: 30 | Fill #1

## 2019-07-25 MED FILL — PROGESTERONE MICRONIZED 200: 200 | 30 days supply | Qty: 30 | Fill #1

## 2019-07-25 NOTE — Patient Instructions (Signed)
Consider scheduling a colonoscopy.  Follow-up for bone density as scheduled.  Follow-up in 1 year for annual exam

## 2019-07-25 NOTE — Progress Notes (Signed)
    Terri [name] 1950/10/27 [id number]        68 y.o.  [id number] for annual gynecologic exam.  Without gynecologic complaints  Past medical history,surgical history, problem list, medications, allergies, family history and social history were all reviewed and documented as reviewed in the EPIC chart.  ROS:  Performed with pertinent positives and negatives included in the history, assessment and plan.   Additional significant findings : None   Exam: Caryn Bee assistant Vitals:   07/25/19 1516  BP: 124/76  Weight: 155 lb (70.3 kg)  Height: 5\' 1"  (1.549 m)   Body mass index is 29.29 kg/m.  General appearance:  Normal affect, orientation and appearance. Skin: Grossly normal HEENT: Without gross lesions.  No cervical or supraclavicular adenopathy. Thyroid normal.  Lungs:  Clear without wheezing, rales or rhonchi Cardiac: RR, without RMG Abdominal:  Soft, nontender, without masses, guarding, rebound, organomegaly or hernia Breasts:  Examined lying and sitting without masses, retractions, discharge or axillary adenopathy. Pelvic:  Ext, BUS, Vagina: With atrophic changes.  First to second-degree rectocele.  No significant cystocele or uterine prolapse  Cervix: With atrophic changes  Uterus: Anteverted, normal size, shape and contour, midline and mobile nontender   Adnexa: Without masses or tenderness    Anus and perineum: Normal   Rectovaginal: Normal sphincter tone without palpated masses or tenderness.    Assessment/Plan:  68 y.o. [id number] female for annual gynecologic exam.   1. Postmenopausal.  No significant menopausal symptoms or any vaginal bleeding. 2. Rectocele.  Stable on serial exams.  Without significant symptoms.  Continue to monitor. 3. Mammography due now and patient is going to arrange.  Breast exam normal today. 4. Colonoscopy 2009.  The patient is not interested in repeating her colonoscopy at this time.  Understands the risks of missed disease but declines  colonoscopy or Cologuard. 5. Osteopenia.  DEXA 2018 T score -2.3 FRAX 20% / 1.5%.  Had previously discussed increased FRAX and options for medication which the patient declined.  Recommend follow-up DEXA now at 2-year interval and she will schedule in follow-up for this. 6. Pap smear 2019.  No Pap smear done today.  No history of abnormal Pap smears.  Options to stop screening per current screening guidelines based on age reviewed.  Will readdress on an annual basis. 7. Health maintenance.  No routine lab work done as patient reports routine lab work done elsewhere.  Follow-up for bone density.  Follow-up in 1 year for annual exam   Anastasio Auerbach MD, 4:06 PM 07/25/2019

## 2019-07-26 ENCOUNTER — Ambulatory Visit
Admission: RE | Admit: 2019-07-26 | Discharge: 2019-07-26 | Disposition: A | Discharge status: Home / Self Care | Payer: 59 | Source: Ambulatory Visit | Attending: Gynecology | Admitting: Gynecology

## 2019-07-26 DIAGNOSIS — Z1231 Encounter for screening mammogram for malignant neoplasm of breast: Secondary | ICD-10-CM | POA: Diagnosis not present

## 2019-07-31 ENCOUNTER — Encounter: Payer: Self-pay | Admitting: Gynecology

## 2019-07-31 ENCOUNTER — Ambulatory Visit (INDEPENDENT_AMBULATORY_CARE_PROVIDER_SITE_OTHER): Payer: 59 | Admitting: Internal Medicine

## 2019-07-31 ENCOUNTER — Other Ambulatory Visit: Payer: Self-pay

## 2019-07-31 ENCOUNTER — Encounter (INDEPENDENT_AMBULATORY_CARE_PROVIDER_SITE_OTHER): Payer: Self-pay | Admitting: Internal Medicine

## 2019-07-31 VITALS — BP 124/60 | HR 60 | Ht 61.0 in | Wt 157.0 lb

## 2019-07-31 DIAGNOSIS — R232 Flushing: Secondary | ICD-10-CM | POA: Diagnosis not present

## 2019-07-31 DIAGNOSIS — E559 Vitamin D deficiency, unspecified: Secondary | ICD-10-CM | POA: Diagnosis not present

## 2019-07-31 DIAGNOSIS — M8588 Other specified disorders of bone density and structure, other site: Secondary | ICD-10-CM | POA: Diagnosis not present

## 2019-07-31 DIAGNOSIS — F5101 Primary insomnia: Secondary | ICD-10-CM | POA: Diagnosis not present

## 2019-07-31 MED ORDER — ESTRADIOL 1 MG PO TABS: 1.0000 mg | ORAL_TABLET | Freq: Every day | ORAL | 3 refills | Status: DC

## 2019-07-31 MED FILL — ESTRADIOL 1 MG TABS: 1 | 30 days supply | Qty: 30 | Fill #0

## 2019-07-31 NOTE — Progress Notes (Signed)
Metrics: Intervention Frequency ACO  Documented Smoking Status Yearly  Screened one or more times in 24 months  Cessation Counseling or  Active cessation medication Past 24 months  Past 24 months   Guideline developer: UpToDate (See UpToDate for funding source) Date Released: 2014       Wellness Office Visit  Subjective:  Patient ID: Terri [name], female    DOB: 1951-02-24  Age: 68 y.o. MRN: [id number]  CC: This lady comes in for follow-up of bioidentical hormone therapy, osteopenia. HPI She has tolerated estradiol and progesterone doses and feels that her insomnia has improved and also feels that her hot flashes are improving.  Both her symptoms have not completely resolved but certainly much better.  She denies any side effects from estradiol or progesterone.  Overall, she does feel better. She is also trying to change her dietary habits and has been doing intermittent fasting on most days and tries to eat healthier also. She continues to take vitamin D3 10,000 units daily and is tolerating this.  Past Medical History:  Diagnosis Date  . Asthma    hx no meds  . Contact lens/glasses fitting    wears contacts or glasses  . HLD (hyperlipidemia) 05/30/2019  . Osteoarthritis 05/30/2019  . Osteopenia 12/2016   T score -2.3 FRAX 20%  . Shingles       Family History  Problem Relation Age of Onset  . Heart disease Mother   . Lung cancer Mother   . Hypertension Mother   . Hyperlipidemia Mother   . Cancer Mother   . Hypertension Father   . Stroke Father   . Hyperlipidemia Father   . Breast cancer Maternal Aunt 52  . Lung cancer Maternal Aunt   . Breast cancer Paternal Aunt 76  . Lung cancer Maternal Grandfather   . Aneurysm Paternal Grandmother   . Lung cancer Paternal Grandfather   . 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   Social History   Tobacco Use  . Smoking status: Never Smoker  . Smokeless tobacco: Never Used  Substance Use Topics  . Alcohol use: Yes    Alcohol/week: 0.0 standard drinks    Comment: Occas.    Current Meds  Medication Sig  . Cholecalciferol (VITAMIN D-3) 125 MCG (5000 UT) TABS Take 2 tablets by mouth daily.  Marland Kitchen estradiol (ESTRACE) 0.5 MG tablet Take 1 tablet (0.5 mg total) by mouth daily.  . Flaxseed, Linseed, (FLAX SEED OIL PO) Take by mouth.  . progesterone (PROMETRIUM) 200 MG capsule Take 1 capsule (200 mg total) by mouth every evening.  . [DISCONTINUED] VITAMIN D PO Take by mouth.     Nutrition  Intermittent fasting as mentioned above. Sleep  Better sleep than before overall.  Exercise  None regular. Bio Identical Hormones  Micronized progesterone is being used in this patient for multiple benefits based on studies including protection against uterine cancer, breast cancer, osteoporosis and heart disease. The patient has been counseled regarding side effects, benefits and modes of administration. The patient is agreeable that this therapy is an integral part of her wellness, quality of life and prevention of chronic disease.  Estradiol is being used in this patient for multiple benefits based on several studies including protection against heart disease, cerebrovascular disease, osteoporosis, colon cancer, Alzheimer's disease, macular  degeneration and cataracts. The patient has been counseled regarding benefits and side effects and modes of administration. The patient is agreeable that this therapy is an integral to part of her wellness, quality of life and prevention of chronic disease.  Objective:   Today's Vitals: BP 124/60   Pulse 60   Ht 5\' 1"  (1.549 m)   Wt 157 lb (71.2 kg)   BMI 29.66 kg/m  Vitals with BMI 07/31/2019 07/25/2019 06/20/2019  Height 5\' 1"  5\' 1"  5' 1.5"  Weight 157 lbs 155 lbs 160 lbs  BMI 29.68 [id number] [id number]    Systolic A999333 A999333 [id number]  Diastolic 60 76 78  Pulse 60 - 64     Physical Exam  She looks systemically well.  She has gained just a couple of pounds since her last visit.  No other new physical findings.     Assessment   1. Primary insomnia   2. Hot flashes   3. Osteopenia of spine   4. Vitamin D deficiency disease       Tests ordered No orders of the defined types were placed in this encounter.    Plan: 1. She will continue with bioidentical hormone therapy but I am going to increase the estradiol dose to 1 mg daily. 2. She will continue with vitamin D3 10,000 units daily. 3. I stressed the importance of having a plan now also for exercise and continuing to eat a whole food plant-based diet with intermittent fasting. 4. I will see her in about 6 weeks for follow-up and we will this time repeat all the blood work.   Meds ordered this encounter  Medications  . estradiol (ESTRACE) 1 MG tablet    Sig: Take 1 tablet (1 mg total) by mouth daily.    Dispense:  30 tablet    Refill:  3    Terri Rezendes Luther Parody, MD

## 2019-08-24 MED FILL — traMADol HCL 50 MG TABS: 50 | 10 days supply | Qty: 30 | Fill #0

## 2019-08-28 MED FILL — PROGESTERONE MICRONIZED 200: 200 | 30 days supply | Qty: 30 | Fill #2

## 2019-08-28 MED FILL — ESTRADIOL 1 MG TABS: 1 | 30 days supply | Qty: 30 | Fill #1

## 2019-09-19 ENCOUNTER — Ambulatory Visit (INDEPENDENT_AMBULATORY_CARE_PROVIDER_SITE_OTHER): Payer: 59 | Admitting: Internal Medicine

## 2019-09-26 ENCOUNTER — Ambulatory Visit (INDEPENDENT_AMBULATORY_CARE_PROVIDER_SITE_OTHER): Payer: Self-pay | Admitting: Internal Medicine

## 2019-09-26 ENCOUNTER — Encounter (INDEPENDENT_AMBULATORY_CARE_PROVIDER_SITE_OTHER): Payer: Self-pay | Admitting: Internal Medicine

## 2019-09-26 ENCOUNTER — Other Ambulatory Visit: Payer: Self-pay

## 2019-09-26 DIAGNOSIS — N898 Other specified noninflammatory disorders of vagina: Secondary | ICD-10-CM

## 2019-09-26 MED ORDER — FLUCONAZOLE 150 MG PO TABS: 150.0000 mg | ORAL_TABLET | Freq: Once | ORAL | 1 refills | Status: AC

## 2019-09-26 NOTE — Telephone Encounter (Signed)
[id number] :yes or no?

## 2019-09-26 NOTE — Progress Notes (Signed)
Metrics: Intervention Frequency ACO  Documented Smoking Status Yearly  Screened one or more times in 24 months  Cessation Counseling or  Active cessation medication Past 24 months  Past 24 months   Guideline developer: UpToDate (See UpToDate for funding source) Date Released: 2014       Wellness Office Visit  Subjective:  Patient ID: Terri [name], female    DOB: [birth year]  Age: 69 y.o. MRN: [id number]  CC: This is an audio telemedicine visit with the permission of the patient who is at home and I am in my office.  This visit was to follow-up on symptoms she was having, vagina itching.  HPI  The patient relates that the symptoms started when we increase the dose of estradiol from 0.5 mg daily to 1 mg daily.  The lower dose of estradiol did seem to help her hot flashes. Past Medical History:  Diagnosis Date  . Asthma    hx no meds  . Contact lens/glasses fitting    wears contacts or glasses  . HLD (hyperlipidemia) 05/30/2019  . Osteoarthritis 05/30/2019  . Osteopenia 12/2016   T score -2.3 FRAX 20%  . Shingles       Family History  Problem Relation Age of Onset  . Heart disease Mother   . Lung cancer Mother   . Hypertension Mother   . Hyperlipidemia Mother   . Cancer Mother   . Hypertension Father   . Stroke Father   . Hyperlipidemia Father   . Breast cancer Maternal Aunt 52  . Lung cancer Maternal Aunt   . Breast cancer Paternal Aunt 43  . Lung cancer Maternal Grandfather   . Aneurysm Paternal Grandmother   . Lung cancer Paternal Grandfather   . 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   Social History   Tobacco Use  . Smoking status: Never Smoker  . Smokeless tobacco: Never Used  Substance Use Topics  . Alcohol use: Yes    Alcohol/week: 0.0 standard drinks    Comment: Occas.    Current  Meds  Medication Sig  . Cholecalciferol (VITAMIN D-3) 125 MCG (5000 UT) TABS Take 2 tablets by mouth daily.  Marland Kitchen estradiol (ESTRACE) 0.5 MG tablet Take 1 tablet (0.5 mg total) by mouth daily.  . Flaxseed, Linseed, (FLAX SEED OIL PO) Take by mouth.  . [DISCONTINUED] estradiol (ESTRACE) 1 MG tablet Take 1 tablet (1 mg total) by mouth daily.  . [DISCONTINUED] progesterone (PROMETRIUM) 200 MG capsule Take 1 capsule (200 mg total) by mouth every evening.       Objective:   Today's Vitals: There were no vitals taken for this visit. Vitals with BMI 07/31/2019 07/25/2019 06/20/2019  Height 5\' 1"  5\' 1"  5' 1.5"  Weight 157 lbs 155 lbs 160 lbs  BMI 29.68 A999333 [id number]  Systolic A999333 A999333 [id number]  Diastolic 60 76 78  Pulse 60 - 64     Physical Exam  Her speech is normal on the phone and she appears to be alert and orientated.     Assessment   1. Vaginal itching       Tests ordered No orders of the defined types were placed in this encounter.    Plan: 1. After discussion, we elected that she will stop all bioidentical hormones for the time being, namely estradiol  and progesterone.  I have sent a prescription for Diflucan in case there is a fungal element but I suspect her symptoms will resolve when she stops  especially the estradiol. 2. She will follow up with me in the third week of March and we will see how she is feeling at that point. 3. This phone call lasted 8 minutes.   Meds ordered this encounter  Medications  . fluconazole (DIFLUCAN) 150 MG tablet    Sig: Take 1 tablet (150 mg total) by mouth once for 1 dose.    Dispense:  1 tablet    Refill:  1    Goble Fudala Luther Parody, MD

## 2019-09-27 ENCOUNTER — Encounter (INDEPENDENT_AMBULATORY_CARE_PROVIDER_SITE_OTHER): Payer: Self-pay | Admitting: Internal Medicine

## 2019-09-27 ENCOUNTER — Other Ambulatory Visit (INDEPENDENT_AMBULATORY_CARE_PROVIDER_SITE_OTHER): Payer: Self-pay | Admitting: Internal Medicine

## 2019-09-27 MED ORDER — FLUCONAZOLE 150 MG PO TABS: 150.0000 mg | ORAL_TABLET | Freq: Once | ORAL | 1 refills | Status: AC

## 2019-09-27 NOTE — Telephone Encounter (Signed)
Refill-difluican

## 2019-10-24 ENCOUNTER — Ambulatory Visit (INDEPENDENT_AMBULATORY_CARE_PROVIDER_SITE_OTHER): Payer: Self-pay | Admitting: Internal Medicine

## 2019-11-16 ENCOUNTER — Ambulatory Visit (INDEPENDENT_AMBULATORY_CARE_PROVIDER_SITE_OTHER): Payer: Self-pay | Admitting: Internal Medicine

## 2019-11-21 MED FILL — traMADol HCL 50 MG TABS: 50 | 10 days supply | Qty: 30 | Fill #0

## 2019-11-22 IMAGING — MG DIGITAL SCREENING BILATERAL MAMMOGRAM WITH TOMO AND CAD
8 series · 8 of 24 positions shown · non-contrast
Comparison: Previous exam(s).

CLINICAL DATA: Screening.

EXAM:
DIGITAL SCREENING BILATERAL MAMMOGRAM WITH TOMO AND CAD

[L CC synth-2D]
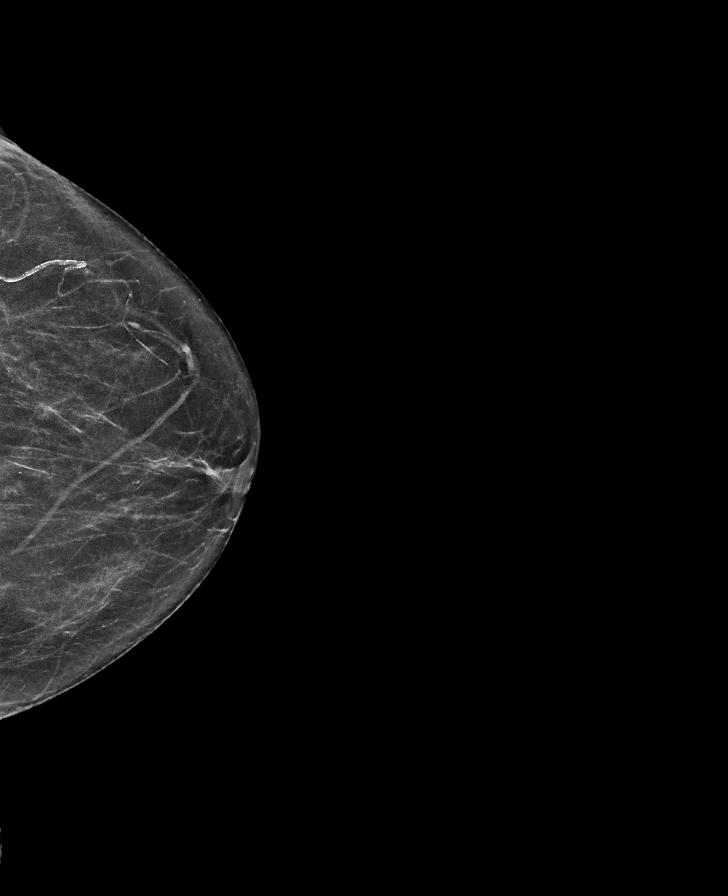

[L MLO synth-2D]
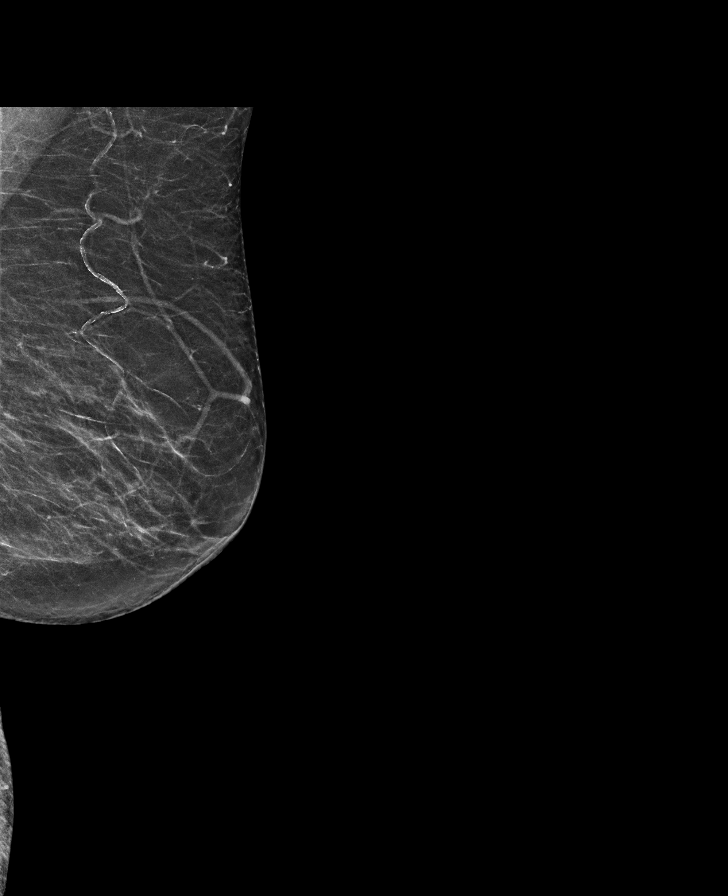

[R MLO synth-2D]
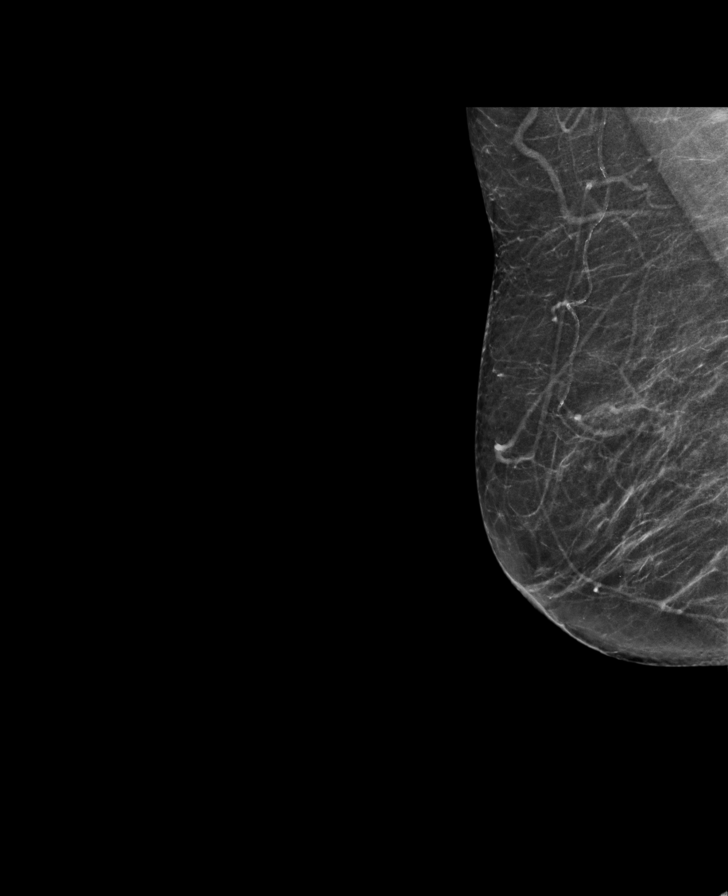

[R CC synth-2D]
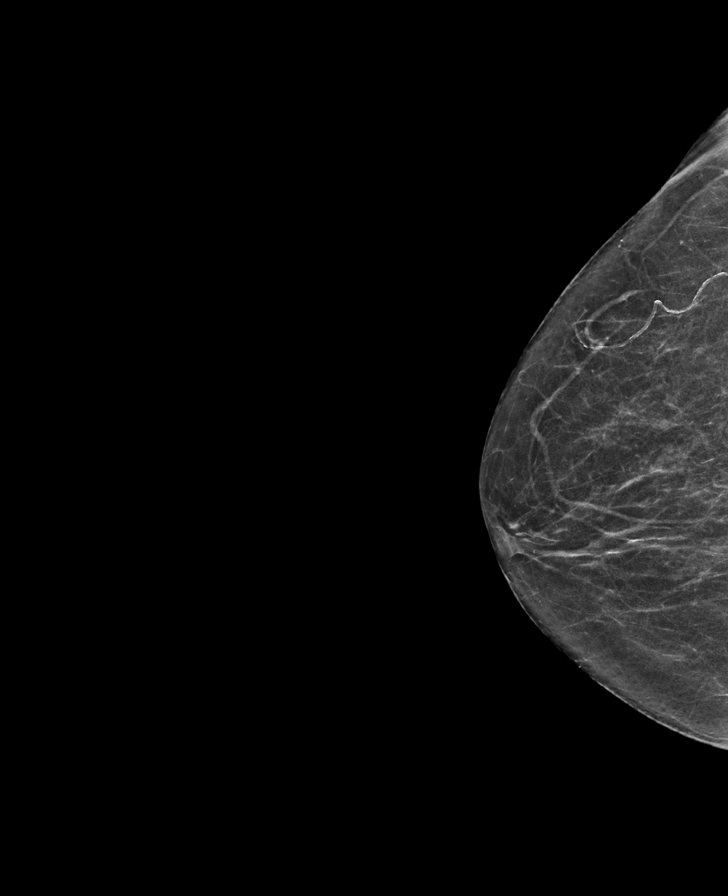

[R MLO tomo · tomo slice 33/64.0]
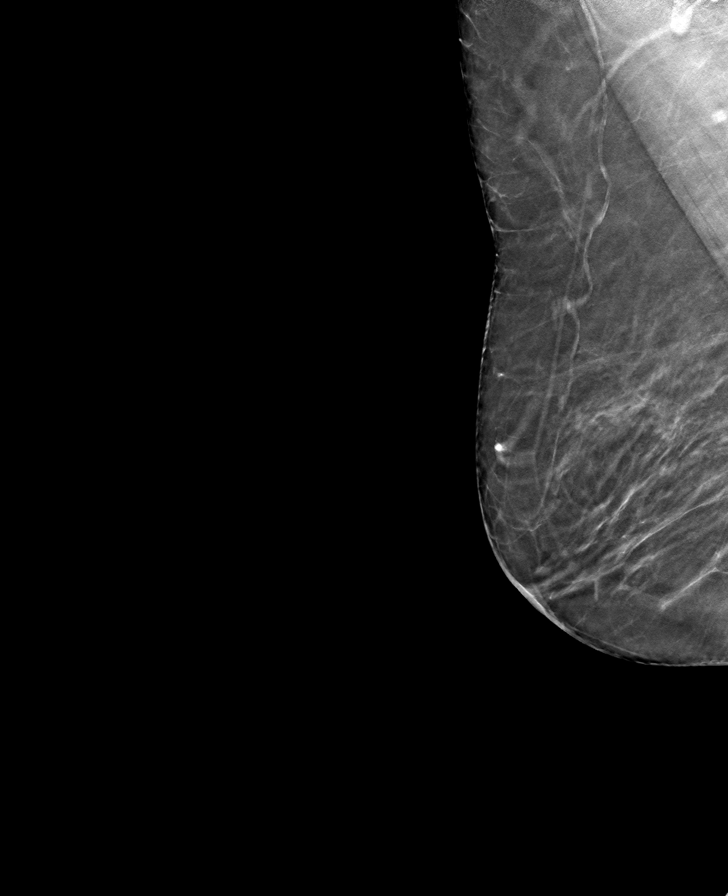

[R CC tomo · tomo slice 29/58.0]
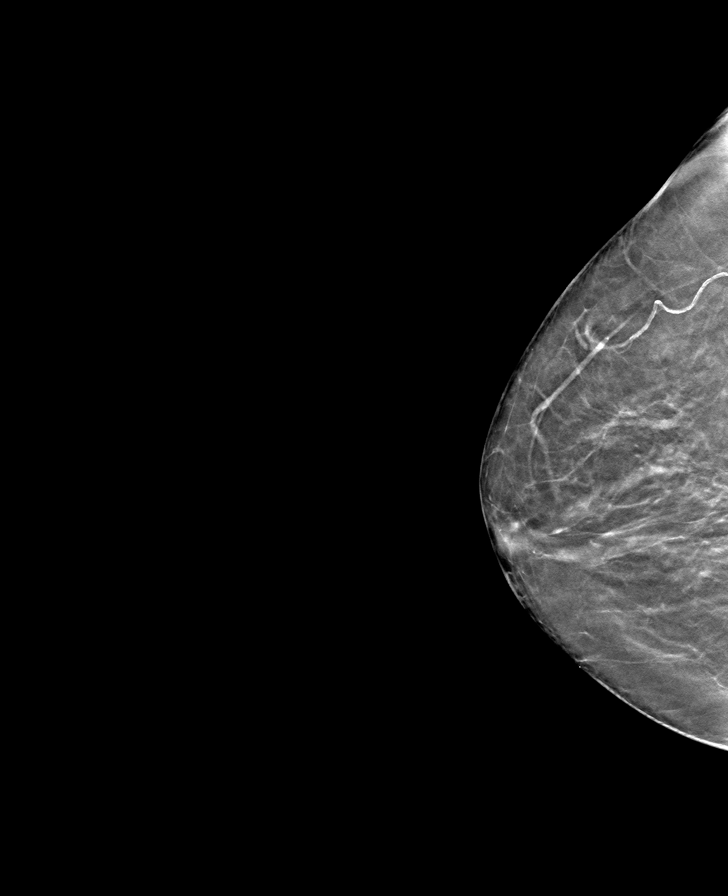

[L MLO tomo · tomo slice 31/62.0]
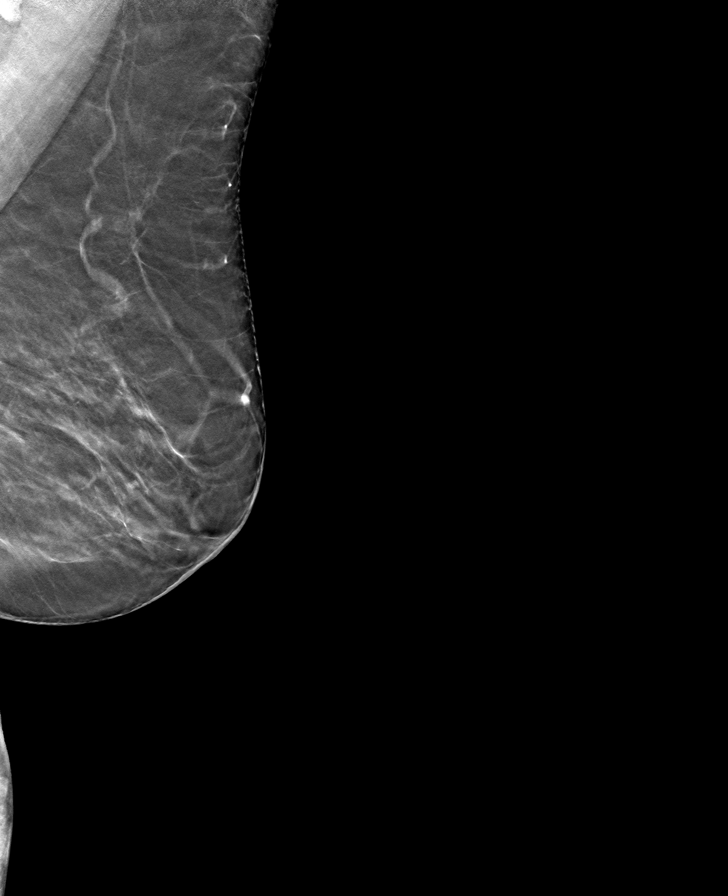

[L CC tomo · tomo slice 31/61.0]
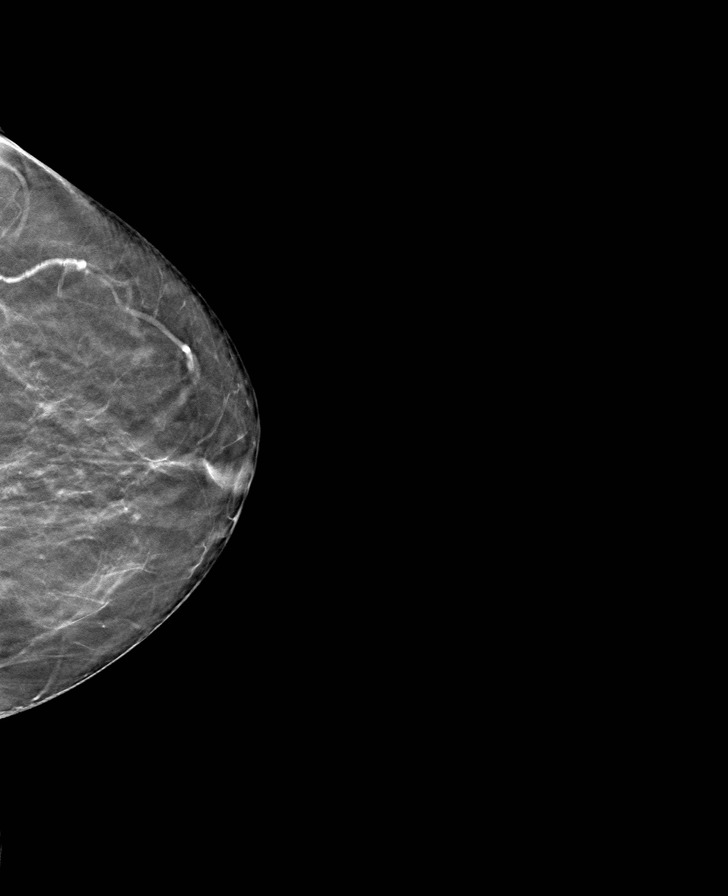

[8 of 24 positions shown; findings below may reference images not displayed]

ACR Breast Density Category b: There are scattered areas of
fibroglandular density.
FINDINGS: There are no findings suspicious for malignancy. Images were
processed with CAD.
IMPRESSION: No mammographic evidence of malignancy. A result letter of this
screening mammogram will be mailed directly to the patient.

RECOMMENDATION:
Screening mammogram in one year. (Code:[id number])

BI-RADS CATEGORY  1: Negative.

## 2020-03-01 MED FILL — traMADol HCL 50 MG TABS: 50 | 10 days supply | Qty: 30 | Fill #0

## 2020-05-02 ENCOUNTER — Ambulatory Visit: Payer: Self-pay | Attending: Internal Medicine

## 2020-05-02 DIAGNOSIS — Z23 Encounter for immunization: Secondary | ICD-10-CM

## 2020-05-02 NOTE — Progress Notes (Signed)
   Covid-19 Vaccination Clinic  Name:  Terri [name]    MRN: [id number] DOB: Feb 08, 1951  05/02/2020  [name] was observed post Covid-19 immunization for 15 minutes without incident. She was provided with Vaccine Information Sheet and instruction to access the V-Safe system.   [name] was instructed to call 911 with any severe reactions post vaccine: Marland Kitchen Difficulty breathing  . Swelling of face and throat  . A fast heartbeat  . A bad rash all over body  . Dizziness and weakness   Immunizations Administered    Name Date Dose VIS Date Route   JANSSEN COVID-19 VACCINE 05/02/2020 10:35 AM 0.5 mL 09/30/2019 Intramuscular   Manufacturer: Alphonsa Overall   Lot: [id number]   [place]: [id number]

## 2020-05-28 ENCOUNTER — Other Ambulatory Visit (HOSPITAL_COMMUNITY): Payer: Self-pay | Admitting: Orthopedic Surgery

## 2020-05-28 MED FILL — traMADol HCL 50 MG TABS: 50 | 10 days supply | Qty: 30 | Fill #0

## 2020-07-29 ENCOUNTER — Encounter: Payer: 59 | Admitting: Obstetrics and Gynecology

## 2020-08-20 ENCOUNTER — Encounter: Payer: Self-pay | Admitting: Obstetrics and Gynecology

## 2020-09-26 ENCOUNTER — Ambulatory Visit (INDEPENDENT_AMBULATORY_CARE_PROVIDER_SITE_OTHER): Payer: PPO | Admitting: Obstetrics and Gynecology

## 2020-09-26 ENCOUNTER — Encounter: Payer: Self-pay | Admitting: Obstetrics and Gynecology

## 2020-09-26 ENCOUNTER — Other Ambulatory Visit: Payer: Self-pay

## 2020-09-26 VITALS — BP 112/60 | HR 81 | Ht 60.75 in | Wt 161.0 lb

## 2020-09-26 DIAGNOSIS — R87610 Atypical squamous cells of undetermined significance on cytologic smear of cervix (ASC-US): Secondary | ICD-10-CM | POA: Diagnosis not present

## 2020-09-26 DIAGNOSIS — Z01419 Encounter for gynecological examination (general) (routine) without abnormal findings: Secondary | ICD-10-CM

## 2020-09-26 DIAGNOSIS — N816 Rectocele: Secondary | ICD-10-CM

## 2020-09-26 DIAGNOSIS — M8580 Other specified disorders of bone density and structure, unspecified site: Secondary | ICD-10-CM | POA: Diagnosis not present

## 2020-09-26 NOTE — Progress Notes (Signed)
Terri [name] [birth year] [id number]  SUBJECTIVE:  70 y.o. [id number] female here for a breast and pelvic exam and Pap smear. She has no gynecologic concerns.  Current Outpatient Medications  Medication Sig Dispense Refill  . Cholecalciferol (VITAMIN D-3) 125 MCG (5000 UT) TABS Take 2 tablets by mouth daily.    . Flaxseed, Linseed, (FLAX SEED OIL PO) Take by mouth.    . estradiol (ESTRACE) 0.5 MG tablet Take 1 tablet (0.5 mg total) by mouth daily. (Patient not taking: Reported on 09/26/2020) 30 tablet 3   No current facility-administered medications for this visit.   Allergies: Aspirin and Penicillins  No LMP recorded. Patient is postmenopausal.  Past medical history,surgical history, problem list, medications, allergies, family history and social history were all reviewed and documented as reviewed in the EPIC chart.  GYN ROS: no abnormal bleeding, pelvic pain or discharge, no breast pain or new or enlarging lumps on self exam.  No dysuria, urinary frequency, pain with urination, cloudy/malodorous urine.   OBJECTIVE:  BP 112/60   Pulse 81   Ht 5' 0.75" (1.543 m)   Wt 161 lb (73 kg)   SpO2 98%   BMI 30.67 kg/m  The patient appears well, alert, oriented, in no distress.  BREAST EXAM: breasts appear normal, no suspicious masses, no skin or nipple changes or axillary nodes  PELVIC EXAM: VULVA: normal appearing vulva with atrophic change, no masses, tenderness or lesions, VAGINA: normal appearing vagina with atrophic change, normal color and discharge, no lesions, first to second-degree rectocele and no cystocele or uterine prolapse, CERVIX: normal appearing atrophic cervix without discharge or lesions, UTERUS: uterus is normal size, shape, consistency and nontender, ADNEXA: normal adnexa in size, nontender and no masses, RECTAL: normal rectal, no masses, PAP: Pap smear done today  Chaperone: Terence Lux present during the examination  ASSESSMENT:  70 y.o. [id number] here for a  breast and pelvic exam  PLAN:   1. Postmenopausal.  Weaned off of estradiol.  Currently without HRT.  No vaginal bleeding.  No significant pauses symptoms. 2.  Rectocele.  Mildly symptomatic.  Will think about if she desires further management and will make an appointment when she is ready to move forward with more in-depth discussion.  We briefly discussed rectocele repair surgery today. 3. Pap smear 07/2018.  No reported history of abnormal Pap smears.  Option to stop screening based on current screening guidelines given age and prior normal Pap smear history is reviewed she desires to continue screening so we went ahead and collected a Pap smear today. 4. Mammogram 07/2019 next month.  Normal breast exam today.  She is reminded to schedule an annual mammogram as she is overdue. 5. Colonoscopy 2009.  We discussed the importance of colon cancer screening.  She does not want to do another colonoscopy due to bad experience previously.  Indicates cousin recently passed from colon cancer.  I highly encouraged her to further discuss colon cancer screening options with her primary doctor. 6. DEXA 12/2016.  T score -2.3, FRAX 20% / 1.5% and medication treatment was reportedly discussed but declined by the patient (per Dr. Loetta Rough note).  Recommend repeat DEXA now, test is ordered. The patient is aware that I will only be at this practice until early March 2022 so she knows to make sure she requests follow-up on any results if any testing is completed when I am no longer at the practice. 7. Health maintenance.  No labs today as she normally has these completed elsewhere.  Return annually or sooner, prn.  Joseph Pierini MD 09/26/20

## 2020-09-26 NOTE — Addendum Note (Signed)
Addended by: Terence Lux A on: 09/26/2020 11:17 AM   Modules accepted: Orders

## 2020-10-02 ENCOUNTER — Other Ambulatory Visit: Payer: Self-pay | Admitting: Obstetrics and Gynecology

## 2020-10-02 DIAGNOSIS — Z1231 Encounter for screening mammogram for malignant neoplasm of breast: Secondary | ICD-10-CM

## 2020-10-02 LAB — PAP IG W/ RFLX HPV ASCU

## 2020-10-02 LAB — HUMAN PAPILLOMAVIRUS, HIGH RISK: HPV DNA High Risk: NOT DETECTED

## 2020-10-02 NOTE — Progress Notes (Signed)
Please tell the patient that the Pap smear returned with minor atypia which does not necessarily indicate an abnormality. The good news is the HPV test is negative. Per the office protocol she is to repeat a Pap smear in 1 year time.

## 2020-10-04 ENCOUNTER — Other Ambulatory Visit (HOSPITAL_COMMUNITY): Payer: Self-pay | Admitting: Orthopaedic Surgery

## 2020-10-04 MED FILL — traMADol HCL 50 MG TABS: 50 | 10 days supply | Qty: 30 | Fill #0

## 2020-11-08 ENCOUNTER — Ambulatory Visit
Admission: RE | Admit: 2020-11-08 | Discharge: 2020-11-08 | Disposition: A | Discharge status: Home / Self Care | Payer: PPO | Source: Ambulatory Visit

## 2020-11-08 DIAGNOSIS — Z1231 Encounter for screening mammogram for malignant neoplasm of breast: Secondary | ICD-10-CM

## 2020-11-12 ENCOUNTER — Telehealth: Payer: Self-pay | Admitting: *Deleted

## 2020-11-12 NOTE — Telephone Encounter (Signed)
Patient called and left message stating wanted Dr.Lavoie name to be on the mammogram and Dexa. I called patient back and left detailed message on voicemail to just let imaging center to add [facility] name on reports.

## 2020-11-13 ENCOUNTER — Other Ambulatory Visit: Payer: Self-pay | Admitting: Obstetrics and Gynecology

## 2020-11-13 ENCOUNTER — Other Ambulatory Visit: Payer: Self-pay

## 2020-11-13 ENCOUNTER — Ambulatory Visit (INDEPENDENT_AMBULATORY_CARE_PROVIDER_SITE_OTHER): Payer: PPO

## 2020-11-13 DIAGNOSIS — Z01419 Encounter for gynecological examination (general) (routine) without abnormal findings: Secondary | ICD-10-CM

## 2020-11-13 DIAGNOSIS — M8589 Other specified disorders of bone density and structure, multiple sites: Secondary | ICD-10-CM | POA: Diagnosis not present

## 2020-11-13 DIAGNOSIS — Z780 Asymptomatic menopausal state: Secondary | ICD-10-CM

## 2020-11-13 DIAGNOSIS — M8580 Other specified disorders of bone density and structure, unspecified site: Secondary | ICD-10-CM

## 2021-01-24 ENCOUNTER — Ambulatory Visit
Admission: EM | Admit: 2021-01-24 | Discharge: 2021-01-24 | Disposition: A | Discharge status: Home / Self Care | Payer: PPO | Attending: Emergency Medicine | Admitting: Emergency Medicine

## 2021-01-24 DIAGNOSIS — Z1152 Encounter for screening for COVID-19: Secondary | ICD-10-CM | POA: Diagnosis not present

## 2021-01-24 NOTE — ED Triage Notes (Signed)
Would like covid test

## 2021-01-25 LAB — NOVEL CORONAVIRUS, NAA: SARS-CoV-2, NAA: DETECTED — AB

## 2021-01-25 LAB — SARS-COV-2, NAA 2 DAY TAT

## 2021-02-20 DIAGNOSIS — M25552 Pain in left hip: Secondary | ICD-10-CM | POA: Diagnosis not present

## 2021-06-17 ENCOUNTER — Other Ambulatory Visit (HOSPITAL_COMMUNITY): Payer: Self-pay | Admitting: Family Medicine

## 2021-06-17 DIAGNOSIS — R0781 Pleurodynia: Secondary | ICD-10-CM | POA: Diagnosis not present

## 2021-06-17 DIAGNOSIS — Z23 Encounter for immunization: Secondary | ICD-10-CM | POA: Diagnosis not present

## 2021-06-17 DIAGNOSIS — M8580 Other specified disorders of bone density and structure, unspecified site: Secondary | ICD-10-CM | POA: Diagnosis not present

## 2021-06-17 DIAGNOSIS — M1612 Unilateral primary osteoarthritis, left hip: Secondary | ICD-10-CM | POA: Insufficient documentation

## 2021-06-17 DIAGNOSIS — E785 Hyperlipidemia, unspecified: Secondary | ICD-10-CM | POA: Diagnosis not present

## 2021-06-17 DIAGNOSIS — M13852 Other specified arthritis, left hip: Secondary | ICD-10-CM | POA: Diagnosis not present

## 2021-06-18 ENCOUNTER — Other Ambulatory Visit (HOSPITAL_COMMUNITY): Payer: Self-pay | Admitting: Family Medicine

## 2021-06-18 DIAGNOSIS — R0781 Pleurodynia: Secondary | ICD-10-CM

## 2021-06-24 ENCOUNTER — Other Ambulatory Visit: Payer: Self-pay

## 2021-06-24 ENCOUNTER — Ambulatory Visit (HOSPITAL_COMMUNITY)
Admission: RE | Admit: 2021-06-24 | Discharge: 2021-06-24 | Disposition: A | Discharge status: Home / Self Care | Payer: PPO | Source: Ambulatory Visit | Attending: Family Medicine | Admitting: Family Medicine

## 2021-06-24 DIAGNOSIS — R0781 Pleurodynia: Secondary | ICD-10-CM

## 2021-07-04 DIAGNOSIS — R350 Frequency of micturition: Secondary | ICD-10-CM | POA: Diagnosis not present

## 2021-07-04 DIAGNOSIS — E785 Hyperlipidemia, unspecified: Secondary | ICD-10-CM | POA: Diagnosis not present

## 2021-07-04 DIAGNOSIS — J019 Acute sinusitis, unspecified: Secondary | ICD-10-CM | POA: Diagnosis not present

## 2021-07-08 ENCOUNTER — Other Ambulatory Visit (HOSPITAL_COMMUNITY): Payer: Self-pay | Admitting: Family Medicine

## 2021-07-08 ENCOUNTER — Other Ambulatory Visit: Payer: Self-pay | Admitting: Family Medicine

## 2021-07-08 DIAGNOSIS — R0781 Pleurodynia: Secondary | ICD-10-CM | POA: Diagnosis not present

## 2021-07-08 DIAGNOSIS — E785 Hyperlipidemia, unspecified: Secondary | ICD-10-CM | POA: Diagnosis not present

## 2021-07-08 DIAGNOSIS — Z23 Encounter for immunization: Secondary | ICD-10-CM | POA: Diagnosis not present

## 2021-07-21 ENCOUNTER — Ambulatory Visit (HOSPITAL_BASED_OUTPATIENT_CLINIC_OR_DEPARTMENT_OTHER): Payer: PPO

## 2021-07-21 ENCOUNTER — Ambulatory Visit (HOSPITAL_BASED_OUTPATIENT_CLINIC_OR_DEPARTMENT_OTHER)
Admission: RE | Admit: 2021-07-21 | Discharge: 2021-07-21 | Disposition: A | Discharge status: Home / Self Care | Payer: PPO | Source: Ambulatory Visit | Attending: Family Medicine | Admitting: Family Medicine

## 2021-07-21 ENCOUNTER — Other Ambulatory Visit: Payer: Self-pay

## 2021-07-21 ENCOUNTER — Encounter (HOSPITAL_BASED_OUTPATIENT_CLINIC_OR_DEPARTMENT_OTHER): Payer: Self-pay

## 2021-07-21 DIAGNOSIS — R0781 Pleurodynia: Secondary | ICD-10-CM

## 2021-07-21 DIAGNOSIS — R109 Unspecified abdominal pain: Secondary | ICD-10-CM | POA: Insufficient documentation

## 2021-07-21 DIAGNOSIS — R079 Chest pain, unspecified: Secondary | ICD-10-CM | POA: Diagnosis not present

## 2021-07-21 LAB — POCT I-STAT CREATININE: Creatinine, Ser: 0.9 mg/dL (ref 0.44–1.00)

## 2021-07-21 MED ORDER — IOHEXOL 300 MG/ML  SOLN
75.0000 mL | Freq: Once | INTRAMUSCULAR | Status: AC | PRN
  Administered 2021-07-21: 15:00:00 75 mL via INTRAVENOUS

## 2021-08-14 ENCOUNTER — Other Ambulatory Visit (HOSPITAL_COMMUNITY): Payer: PPO

## 2021-08-14 ENCOUNTER — Encounter (HOSPITAL_COMMUNITY): Payer: Self-pay

## 2021-10-10 ENCOUNTER — Other Ambulatory Visit: Payer: Self-pay | Admitting: Obstetrics & Gynecology

## 2021-10-10 DIAGNOSIS — Z1231 Encounter for screening mammogram for malignant neoplasm of breast: Secondary | ICD-10-CM

## 2021-10-23 ENCOUNTER — Other Ambulatory Visit (HOSPITAL_COMMUNITY)
Admission: RE | Admit: 2021-10-23 | Discharge: 2021-10-23 | Disposition: A | Discharge status: Home / Self Care | Payer: PPO | Source: Ambulatory Visit | Attending: Obstetrics & Gynecology | Admitting: Obstetrics & Gynecology

## 2021-10-23 ENCOUNTER — Ambulatory Visit (INDEPENDENT_AMBULATORY_CARE_PROVIDER_SITE_OTHER): Payer: PPO | Admitting: Obstetrics & Gynecology

## 2021-10-23 ENCOUNTER — Encounter: Payer: Self-pay | Admitting: Obstetrics & Gynecology

## 2021-10-23 ENCOUNTER — Other Ambulatory Visit: Payer: Self-pay

## 2021-10-23 VITALS — BP 118/70 | HR 68 | Resp 16 | Ht 60.25 in | Wt 167.0 lb

## 2021-10-23 DIAGNOSIS — N816 Rectocele: Secondary | ICD-10-CM

## 2021-10-23 DIAGNOSIS — M8589 Other specified disorders of bone density and structure, multiple sites: Secondary | ICD-10-CM | POA: Diagnosis not present

## 2021-10-23 DIAGNOSIS — Z9289 Personal history of other medical treatment: Secondary | ICD-10-CM | POA: Diagnosis not present

## 2021-10-23 DIAGNOSIS — Z780 Asymptomatic menopausal state: Secondary | ICD-10-CM

## 2021-10-23 DIAGNOSIS — R87610 Atypical squamous cells of undetermined significance on cytologic smear of cervix (ASC-US): Secondary | ICD-10-CM | POA: Diagnosis not present

## 2021-10-23 DIAGNOSIS — Z01419 Encounter for gynecological examination (general) (routine) without abnormal findings: Secondary | ICD-10-CM | POA: Diagnosis not present

## 2021-10-23 DIAGNOSIS — N644 Mastodynia: Secondary | ICD-10-CM | POA: Diagnosis not present

## 2021-10-23 NOTE — Progress Notes (Addendum)
? ? ?Terri [name] 10/30/1950 [id number] ? ? ?History:    71 y.o. G4P4L4 ? ?RP:  Established patient presenting for annual gyn exam  ? ?HPI: Postmenopausal, well on no HRT.  No PMB.  No pelvic pain.  Abstinent.  Pap ASCUS 09/2020.  HPV HR Neg. Pap reflex today. Rt Breast normal.  Lt Breast tender at 12 O'Clock, close to the nipple.  Mammo 11/2020 Neg. Colonoscopy 2009, declines further Colonoscopy because of a bad experience.  Bone Density 11/2020 Osteopenia with T-Score -1.9 at the AP Spine.  Frax not increased.  Will repeat a BD in 11/2022.  Vit D supplements, Ca++ 1.5 g/d total, increase weight bearing physical activities.  BMI 32.34.  Health labs with Fam MD. ? ?Past medical history,surgical history, family history and social history were all reviewed and documented in the EPIC chart. ? ?Gynecologic History ?No LMP recorded. Patient is postmenopausal. ? ?Obstetric History ?OB History  ?Gravida Para Term Preterm AB Living  ?'4 4 4     4  '$ ?SAB IAB Ectopic Multiple Live Births  ?           ?  ?# Outcome Date GA Lbr Len/2nd Weight Sex Delivery Anes PTL Lv  ?4 Term           ?3 Term           ?2 Term           ?1 Term           ? ? ? ?ROS: A ROS was performed and pertinent positives and negatives are included in the history. ? GENERAL: No fevers or chills. HEENT: No change in vision, no earache, sore throat or sinus congestion. NECK: No pain or stiffness. CARDIOVASCULAR: No chest pain or pressure. No palpitations. PULMONARY: No shortness of breath, cough or wheeze. GASTROINTESTINAL: No abdominal pain, nausea, vomiting or diarrhea, melena or bright red blood per rectum. GENITOURINARY: No urinary frequency, urgency, hesitancy or dysuria. MUSCULOSKELETAL: No joint or muscle pain, no back pain, no recent trauma. DERMATOLOGIC: No rash, no itching, no lesions. ENDOCRINE: No polyuria, polydipsia, no heat or cold intolerance. No recent change in weight. HEMATOLOGICAL: No anemia or easy bruising or bleeding. NEUROLOGIC: No  headache, seizures, numbness, tingling or weakness. PSYCHIATRIC: No depression, no loss of interest in normal activity or change in sleep pattern.  ?  ? ?Exam: ? ? ?BP 118/70   Pulse 68   Resp 16   Ht 5' 0.25" (1.53 m)   Wt 167 lb (75.8 kg)   BMI 32.34 kg/m?  ? ?Body mass index is 32.34 kg/m?. ? ?General appearance : Well developed well nourished female. No acute distress ?HEENT: Eyes: no retinal hemorrhage or exudates,  Neck supple, trachea midline, no carotid bruits, no thyroidmegaly ?Lungs: Clear to auscultation, no rhonchi or wheezes, or rib retractions  ?Heart: Regular rate and rhythm, no murmurs or gallops ?Breast:Examined in sitting and supine position were symmetrical in appearance, no palpable masses or tenderness on the Rt breast.  Lt breast tender with a small mobile cyst at 12 O'Clock,  no skin retraction, no nipple inversion, no nipple discharge, no skin discoloration, no axillary or supraclavicular lymphadenopathy ?Abdomen: no palpable masses or tenderness, no rebound or guarding ?Extremities: no edema or skin discoloration or tenderness ? ?Pelvic: Vulva: Normal ?            Vagina: No gross lesions or discharge.  Rectocele grade 2/3. ? Cervix: No gross lesions or discharge.  Pap reflex  done. ? Uterus  AV, normal size, shape and consistency, non-tender and mobile ? Adnexa  Without masses or tenderness ? Anus: Normal ? ? ?Assessment/Plan:  71 y.o. female for annual exam  ? ?1. Encounter for routine gynecological examination with Papanicolaou smear of cervix ?Postmenopausal, well on no HRT.  No PMB.  No pelvic pain.  Abstinent.  Pap ASCUS 09/2020.  HPV HR Neg.  Pap reflex today. Rt Breast normal.  Lt Breast tender at 12 O'Clock, close to the nipple.  Mammo 11/2020 Neg. Colonoscopy 2009, declines further Colonoscopy because of a bad experience.  Bone Density 11/2020 Osteopenia with T-Score -1.9 at the AP Spine.  Frax not increased.  Will repeat a BD in 11/2022.  Vit D supplements, Ca++ 1.5 g/d total,  increase weight bearing physical activities.  BMI 32.34.  Health labs with Fam MD. ?- Cytology - PAP( [facility]) ? ?2. Atypical squamous cell changes of undetermined significance (ASCUS) on cervical cytology with negative high risk human papilloma virus (HPV) test result ? ?3. Postmenopause ?Postmenopausal, well on no HRT.  No PMB.  No pelvic pain.  Abstinent.   ? ?4. Breast pain, left ?Lt breast tender with a small mobile cyst at 12 O'Clock. Schedule a Left Dx mammo/US.  Rt screening mammo. ? ?5. Osteopenia of multiple sites ?Bone Density 11/2020 Osteopenia with T-Score -1.9 at the AP Spine.  Frax not increased.  Will repeat a BD in 11/2022.  Vit D supplements, Ca++ 1.5 g/d total, increase weight bearing physical activities.  BMI 32.34. ?- DG Bone Density; Future ? ?6. Rectocele ?Mildly Sxic.  Will continue to observe. ? ?7. Personal history of other medical treatment ? ?Other orders ?- Acetaminophen (TYLENOL PO); Take by mouth. ?- IBUPROFEN PO; Take by mouth.  ? ?Princess Bruins MD, 4:41 PM 10/23/2021 ? ?  ?

## 2021-10-24 ENCOUNTER — Other Ambulatory Visit: Payer: Self-pay | Admitting: Obstetrics & Gynecology

## 2021-10-24 ENCOUNTER — Telehealth: Payer: Self-pay

## 2021-10-24 DIAGNOSIS — N644 Mastodynia: Secondary | ICD-10-CM

## 2021-10-24 NOTE — Telephone Encounter (Signed)
Terri Bruins, MD  P [facility] Triage ? Lt breast tender with a small mobile cyst at 12 O'Clock.  Lt Dx mammo/US.  Rt screening mammo.  ? ?First available is at 11/17/21 @ 2:00. ? ?Pt notified and voiced understanding.  ?

## 2021-10-27 LAB — CYTOLOGY - PAP: Diagnosis: NEGATIVE

## 2021-11-11 ENCOUNTER — Ambulatory Visit: Payer: PPO

## 2021-11-12 ENCOUNTER — Telehealth: Payer: Self-pay

## 2021-11-12 ENCOUNTER — Other Ambulatory Visit (HOSPITAL_COMMUNITY): Payer: Self-pay

## 2021-11-12 MED ORDER — NYSTATIN 100000 UNIT/GM EX POWD
1.0000 "application " | Freq: Two times a day (BID) | CUTANEOUS | 2 refills | Status: DC
  Filled 2021-11-12: qty 30, 15d supply, fill #0

## 2021-11-12 NOTE — Telephone Encounter (Signed)
Rx sent 

## 2021-11-12 NOTE — Telephone Encounter (Signed)
Terri Bruins, MD  Judy Pimple D, CMA 30 minutes ago (1:39 PM)  ? ?Yes, please send Nystatin powder twice a day as needed with refills.   ? ?

## 2021-11-12 NOTE — Telephone Encounter (Signed)
Pt calling to inquire about Rx you planned to send her for a powder to help with itching/irritation under skin folds. She was seen on 10/23/21. Please advise. Rx can be sent to [facility] on [address]  ?

## 2021-11-17 ENCOUNTER — Ambulatory Visit
Admission: RE | Admit: 2021-11-17 | Discharge: 2021-11-17 | Disposition: A | Discharge status: Home / Self Care | Payer: HMO | Source: Ambulatory Visit | Attending: Obstetrics & Gynecology | Admitting: Obstetrics & Gynecology

## 2021-11-17 ENCOUNTER — Other Ambulatory Visit (HOSPITAL_COMMUNITY): Payer: Self-pay

## 2021-11-17 ENCOUNTER — Ambulatory Visit
Admission: RE | Admit: 2021-11-17 | Discharge: 2021-11-17 | Disposition: A | Discharge status: Home / Self Care | Payer: PPO | Source: Ambulatory Visit | Attending: Obstetrics & Gynecology | Admitting: Obstetrics & Gynecology

## 2021-11-17 DIAGNOSIS — N644 Mastodynia: Secondary | ICD-10-CM

## 2021-11-17 DIAGNOSIS — R928 Other abnormal and inconclusive findings on diagnostic imaging of breast: Secondary | ICD-10-CM | POA: Diagnosis not present

## 2021-11-17 DIAGNOSIS — N6489 Other specified disorders of breast: Secondary | ICD-10-CM | POA: Diagnosis not present

## 2021-12-05 DIAGNOSIS — Z0189 Encounter for other specified special examinations: Secondary | ICD-10-CM | POA: Diagnosis not present

## 2021-12-16 DIAGNOSIS — M1612 Unilateral primary osteoarthritis, left hip: Secondary | ICD-10-CM | POA: Diagnosis not present

## 2021-12-16 DIAGNOSIS — Z96642 Presence of left artificial hip joint: Secondary | ICD-10-CM | POA: Diagnosis not present

## 2021-12-27 ENCOUNTER — Other Ambulatory Visit (HOSPITAL_COMMUNITY): Payer: Self-pay

## 2021-12-27 MED ORDER — TRAMADOL HCL 50 MG PO TABS
50.0000 mg | ORAL_TABLET | Freq: Four times a day (QID) | ORAL | 0 refills | Status: DC | PRN
  Filled 2021-12-27: qty 40, 5d supply, fill #0

## 2021-12-30 ENCOUNTER — Other Ambulatory Visit (HOSPITAL_COMMUNITY): Payer: Self-pay

## 2021-12-31 ENCOUNTER — Other Ambulatory Visit (HOSPITAL_COMMUNITY): Payer: Self-pay

## 2021-12-31 MED ORDER — OXYCODONE HCL 5 MG PO TABS
ORAL_TABLET | ORAL | 0 refills | Status: DC
  Filled 2021-12-31: qty 42, 7d supply, fill #0

## 2022-01-01 ENCOUNTER — Other Ambulatory Visit (HOSPITAL_COMMUNITY): Payer: Self-pay

## 2022-01-30 DIAGNOSIS — Z5189 Encounter for other specified aftercare: Secondary | ICD-10-CM | POA: Diagnosis not present

## 2022-03-10 DIAGNOSIS — Z4789 Encounter for other orthopedic aftercare: Secondary | ICD-10-CM | POA: Diagnosis not present

## 2022-03-31 DIAGNOSIS — L820 Inflamed seborrheic keratosis: Secondary | ICD-10-CM | POA: Diagnosis not present

## 2022-03-31 DIAGNOSIS — Z1283 Encounter for screening for malignant neoplasm of skin: Secondary | ICD-10-CM | POA: Diagnosis not present

## 2022-03-31 DIAGNOSIS — D225 Melanocytic nevi of trunk: Secondary | ICD-10-CM | POA: Diagnosis not present

## 2022-04-30 DIAGNOSIS — E785 Hyperlipidemia, unspecified: Secondary | ICD-10-CM | POA: Diagnosis not present

## 2022-05-05 DIAGNOSIS — M8580 Other specified disorders of bone density and structure, unspecified site: Secondary | ICD-10-CM | POA: Diagnosis not present

## 2022-05-05 DIAGNOSIS — Z8349 Family history of other endocrine, nutritional and metabolic diseases: Secondary | ICD-10-CM | POA: Diagnosis not present

## 2022-05-05 DIAGNOSIS — Z0001 Encounter for general adult medical examination with abnormal findings: Secondary | ICD-10-CM | POA: Diagnosis not present

## 2022-05-05 DIAGNOSIS — E785 Hyperlipidemia, unspecified: Secondary | ICD-10-CM | POA: Diagnosis not present

## 2022-05-05 DIAGNOSIS — Z0000 Encounter for general adult medical examination without abnormal findings: Secondary | ICD-10-CM | POA: Diagnosis not present

## 2022-05-05 DIAGNOSIS — R0781 Pleurodynia: Secondary | ICD-10-CM | POA: Diagnosis not present

## 2022-05-05 DIAGNOSIS — M13852 Other specified arthritis, left hip: Secondary | ICD-10-CM | POA: Diagnosis not present

## 2022-05-25 DIAGNOSIS — B001 Herpesviral vesicular dermatitis: Secondary | ICD-10-CM | POA: Insufficient documentation

## 2022-06-19 DIAGNOSIS — Z96642 Presence of left artificial hip joint: Secondary | ICD-10-CM | POA: Diagnosis not present

## 2022-10-06 ENCOUNTER — Other Ambulatory Visit: Payer: Self-pay | Admitting: Obstetrics & Gynecology

## 2022-10-06 DIAGNOSIS — Z1231 Encounter for screening mammogram for malignant neoplasm of breast: Secondary | ICD-10-CM

## 2022-11-18 ENCOUNTER — Ambulatory Visit
Admission: RE | Admit: 2022-11-18 | Discharge: 2022-11-18 | Disposition: A | Discharge status: Home / Self Care | Payer: No Typology Code available for payment source | Source: Ambulatory Visit

## 2022-11-18 DIAGNOSIS — Z1231 Encounter for screening mammogram for malignant neoplasm of breast: Secondary | ICD-10-CM

## 2022-12-02 DIAGNOSIS — Z6830 Body mass index (BMI) 30.0-30.9, adult: Secondary | ICD-10-CM | POA: Diagnosis not present

## 2022-12-02 DIAGNOSIS — E669 Obesity, unspecified: Secondary | ICD-10-CM | POA: Diagnosis not present

## 2022-12-02 DIAGNOSIS — Z008 Encounter for other general examination: Secondary | ICD-10-CM | POA: Diagnosis not present

## 2022-12-02 DIAGNOSIS — N182 Chronic kidney disease, stage 2 (mild): Secondary | ICD-10-CM | POA: Diagnosis not present

## 2022-12-07 DIAGNOSIS — N390 Urinary tract infection, site not specified: Secondary | ICD-10-CM | POA: Diagnosis not present

## 2022-12-07 DIAGNOSIS — Z713 Dietary counseling and surveillance: Secondary | ICD-10-CM | POA: Diagnosis not present

## 2022-12-07 DIAGNOSIS — M25511 Pain in right shoulder: Secondary | ICD-10-CM | POA: Diagnosis not present

## 2022-12-07 DIAGNOSIS — R350 Frequency of micturition: Secondary | ICD-10-CM | POA: Diagnosis not present

## 2022-12-07 DIAGNOSIS — G4700 Insomnia, unspecified: Secondary | ICD-10-CM | POA: Diagnosis not present

## 2022-12-07 DIAGNOSIS — E785 Hyperlipidemia, unspecified: Secondary | ICD-10-CM | POA: Diagnosis not present

## 2022-12-07 DIAGNOSIS — Z6830 Body mass index (BMI) 30.0-30.9, adult: Secondary | ICD-10-CM | POA: Diagnosis not present

## 2022-12-07 DIAGNOSIS — R1012 Left upper quadrant pain: Secondary | ICD-10-CM | POA: Diagnosis not present

## 2022-12-20 NOTE — Progress Notes (Unsigned)
Referring Provider:Dr. Margo Aye Primary Care Physician:  Leone Payor, FNP Primary Gastroenterologist:  Dr. Jena Gauss   Chief Complaint  Patient presents with   Abdominal Pain    Pain on left upper side    HPI:   Terri [name] is a 72 y.o. female presenting today at the request of Dr. Margo Aye for LUQ pain.   Reviewed office visit with PCP 12/09/2022.  Patient reporting intermittent LUQ abdominal pain when she is up a lot, feels a pulsing sensation when this happened.  Prior CT in 2022 with nothing acute.  Recommended GI referral.  Reviewed labs included in referral dated 04/30/2022.  CMP unremarkable aside from slightly elevated alkaline phosphatase of 131.  CBC within normal limits.  Reviewed CT A/P with contrast dated 07/21/2021.  Normal-appearing liver, no evidence of cholelithiasis, stomach, large, small bowel unremarkable, pancreas, spleen unremarkable.  She did have remote appearing superior endplate fracture of T11 with approximately 30% loss of height.  Today:  Fell 7-8 years ago onto the arm of a chair due to slipping on the wet floor she was mopping.  She started having LUQ abdominal pain at that time, just under her rib cage.  Symptoms are intermittent. Notices only when she is doing a lot of activity.  Described as achy pain. When she stops doing what she is doing, symptoms resolve. Comes back if she starts doing something. Doesn't usually take anything to help. No association with meals, nausea, vomiting, or association with bowel movements. Bowels move well. No history of GERD, dysphagia, brbpr, or melena.   No nocturnal pain. No unintentional weight loss.  Denies chest pain or shortness of breath.  Last colonoscopy was in 2008-2009. Dr. Loreta Ave in [place]. No polyps. Has cologuard from PCP and plans to complete.  No prior EGD.   Past Medical History:  Diagnosis Date   Asthma    hx no meds   Contact lens/glasses fitting    wears contacts or glasses   HLD (hyperlipidemia)  05/30/2019   Osteoarthritis 05/30/2019   Osteopenia 12/2016   T score -2.3 FRAX 20%   Shingles    Spinal fracture of T11 vertebra [facility])     Past Surgical History:  Procedure Laterality Date   COLONOSCOPY     2008-2009 with Dr. Loreta Ave in [place], no polyps per patient   METATARSAL OSTEOTOMY WITH BUNIONECTOMY Right 09/19/2013   Procedure: RIGHT FOOT CHEVRON OSTEOTOMY AND 2ND TOE RESECTION  ARTHOPLASTY with pin fixation;  Surgeon: Velna Ochs, MD;  Location: [facility];  Service: Orthopedics;  Laterality: Right;   TUBAL LIGATION     WISDOM TOOTH EXTRACTION      Current Outpatient Medications  Medication Sig Dispense Refill   Acetaminophen (TYLENOL PO) Take by mouth.     Cholecalciferol (VITAMIN D-3) 125 MCG (5000 UT) TABS Take 1 tablet by mouth. occ     nystatin (MYCOSTATIN/NYSTOP) powder Apply topically 2 times daily as directed 30 g 2   No current facility-administered medications for this visit.    Allergies as of 12/23/2022 - Review Complete 12/23/2022  Allergen Reaction Noted   Other Anaphylaxis 10/23/2021   Aspirin Swelling 02/20/2013   Penicillins Other (See Comments) 02/20/2013    Family History  Problem Relation Age of Onset   Heart disease Mother    Lung cancer Mother    Hypertension Mother    Hyperlipidemia Mother    Cancer Mother    Hypertension Father    Stroke Father    Hyperlipidemia Father  Lung cancer Maternal Grandfather    Aneurysm Paternal Grandmother    Lung cancer Paternal Grandfather    Breast cancer Maternal Aunt 52   Lung cancer Maternal Aunt    Colon cancer Maternal Aunt    Breast cancer Paternal Aunt 50   Aneurysm Cousin        4 Maternal 1st cousins   Breast cancer Cousin 107   Colon cancer Cousin    Pancreatic cancer Maternal Uncle     Social History   Socioeconomic History   Marital status: Single    Spouse name: Not on file   Number of children: Not on file   Years of education: Not on file   Highest  education level: Not on file  Occupational History   Not on file  Tobacco Use   Smoking status: Never   Smokeless tobacco: Never  Vaping Use   Vaping Use: Never used  Substance and Sexual Activity   Alcohol use: Not Currently    Comment: Occas.   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   Social Determinants of Health   Financial Resource Strain: Not on file  Food Insecurity: Not on file  Transportation Needs: Not on file  Physical Activity: Not on file  Stress: Not on file  Social Connections: Not on file  Intimate Partner Violence: Not on file    Review of Systems: Gen: Denies any fever, chills, cold or flulike symptoms, presyncope, syncope. CV: Denies chest pain, heart palpitations.  Resp: Denies shortness of breath, cough. GI: See HPI GU : Denies urinary burning, urinary frequency, urinary hesitancy MS: Denies joint pain. Derm: Denies rash. Psych: Denies depression, anxiety. Heme: See HPI  Physical Exam: BP 122/80 (BP Location: Right Arm, Patient Position: Sitting, Cuff Size: Normal)   Pulse 77   Temp 97.7 F (36.5 C) (Temporal)   Ht 5\' 1"  (1.549 m)   Wt 164 lb 12.8 oz (74.8 kg)   SpO2 97%   BMI 31.14 kg/m  General:   Alert and oriented. Pleasant and cooperative. Well-nourished and well-developed.  Head:  Normocephalic and atraumatic. Eyes:  Without icterus, sclera clear and conjunctiva pink.  Ears:  Normal auditory acuity. Lungs:  Clear to auscultation bilaterally. No wheezes, rales, or rhonchi. No distress.  Heart:  S1, S2 present without murmurs appreciated.  Abdomen:  +BS, soft, non-tender and non-distended. No HSM noted. No guarding or rebound. No masses  appreciated.  Rectal:  Deferred  Msk:  Symmetrical without gross deformities. Normal posture. Extremities:  Without edema. Neurologic:  Alert and  oriented x4;  grossly normal neurologically. Skin:  Intact without significant lesions or rashes. Psych: Normal mood and affect.    Assessment:  72 year old female with history of asthma, HLD, osteoarthritis, osteopenia, T11 spinal fracture, presenting today for further evaluation of chronic, intermittent LUQ abdominal pain, just under her left rib cage, that started 7-8 years ago after falling on the arm of a chair.  She reports symptoms are only occurring with activity.  No association with meals, bowel movements.  No alarm symptoms.  No other significant GI symptoms.  Abdominal exam is benign today.  Prior CT A/P with contrast in December 2022 with no significant abnormalities within the abdomen  or pelvis.  She did have evidence of a remote appearing superior endplate fracture of T11.  Suspect symptoms are MSK in etiology, poss related to prior T11 fracture.  We discussed possibly repeating a CT to ensure nothing has changed/developed.  Could consider EGD, but do not suspect we will find anything.  Ultimately, patient decided to continue to monitor as her symptoms have not changed over the last 7-8 years.   Plan:  Patient will continue to monitor for intermittent LUQ abdominal pain and let us know of any worsening symptoms or he develops other associated GI symptoms. Advised that she can use Tylenol or ibuprofen as needed. Follow-up as needed.   Ermalinda Memos, PA-C [facility] 12/23/2022

## 2022-12-23 ENCOUNTER — Ambulatory Visit (INDEPENDENT_AMBULATORY_CARE_PROVIDER_SITE_OTHER): Payer: No Typology Code available for payment source | Admitting: Gastroenterology

## 2022-12-23 ENCOUNTER — Encounter: Payer: Self-pay | Admitting: Gastroenterology

## 2022-12-23 VITALS — BP 122/80 | HR 77 | Temp 97.7°F | Ht 61.0 in | Wt 164.8 lb

## 2022-12-23 DIAGNOSIS — R1012 Left upper quadrant pain: Secondary | ICD-10-CM | POA: Diagnosis not present

## 2022-12-23 NOTE — Patient Instructions (Signed)
I suspect your intermittent left upper quadrant abdominal pain is secondary to a musculoskeletal cause as her symptoms are only triggered by activity.  If you start to notice any change in your symptoms, worsening, association with meals, bowel movements, nausea, vomiting, please let me know.  As we discussed, we can certainly order a CT for you to reevaluate if you desire.  You may use ibuprofen or Tylenol as needed.  We will follow-up with you as needed.  Do not hesitate to call if you develop any new GI concerns.  It was very nice to meet you today!  Ermalinda Memos, PA-C [facility]

## 2022-12-24 ENCOUNTER — Encounter: Payer: Self-pay | Admitting: Gastroenterology

## 2022-12-24 ENCOUNTER — Ambulatory Visit
Admission: RE | Admit: 2022-12-24 | Discharge: 2022-12-24 | Disposition: A | Discharge status: Home / Self Care | Payer: No Typology Code available for payment source | Source: Ambulatory Visit | Attending: Obstetrics & Gynecology | Admitting: Obstetrics & Gynecology

## 2022-12-24 DIAGNOSIS — Z1231 Encounter for screening mammogram for malignant neoplasm of breast: Secondary | ICD-10-CM | POA: Diagnosis not present

## 2022-12-31 DIAGNOSIS — E785 Hyperlipidemia, unspecified: Secondary | ICD-10-CM | POA: Diagnosis not present

## 2023-01-06 DIAGNOSIS — R7303 Prediabetes: Secondary | ICD-10-CM | POA: Insufficient documentation

## 2023-01-07 DIAGNOSIS — Z0000 Encounter for general adult medical examination without abnormal findings: Secondary | ICD-10-CM | POA: Diagnosis not present

## 2023-01-07 DIAGNOSIS — R7303 Prediabetes: Secondary | ICD-10-CM | POA: Diagnosis not present

## 2023-01-07 DIAGNOSIS — E785 Hyperlipidemia, unspecified: Secondary | ICD-10-CM | POA: Diagnosis not present

## 2023-01-07 DIAGNOSIS — M8580 Other specified disorders of bone density and structure, unspecified site: Secondary | ICD-10-CM | POA: Diagnosis not present

## 2023-01-07 DIAGNOSIS — Z8349 Family history of other endocrine, nutritional and metabolic diseases: Secondary | ICD-10-CM | POA: Diagnosis not present

## 2023-01-07 DIAGNOSIS — M13852 Other specified arthritis, left hip: Secondary | ICD-10-CM | POA: Diagnosis not present

## 2023-01-07 DIAGNOSIS — R0781 Pleurodynia: Secondary | ICD-10-CM | POA: Diagnosis not present

## 2023-04-15 DIAGNOSIS — M25551 Pain in right hip: Secondary | ICD-10-CM | POA: Diagnosis not present

## 2023-04-15 DIAGNOSIS — Z96642 Presence of left artificial hip joint: Secondary | ICD-10-CM | POA: Diagnosis not present

## 2023-05-20 DIAGNOSIS — M25551 Pain in right hip: Secondary | ICD-10-CM | POA: Diagnosis not present

## 2023-07-19 DIAGNOSIS — H524 Presbyopia: Secondary | ICD-10-CM | POA: Diagnosis not present

## 2023-07-19 DIAGNOSIS — H52223 Regular astigmatism, bilateral: Secondary | ICD-10-CM | POA: Diagnosis not present

## 2023-07-19 DIAGNOSIS — H5213 Myopia, bilateral: Secondary | ICD-10-CM | POA: Diagnosis not present

## 2023-08-05 DIAGNOSIS — R7303 Prediabetes: Secondary | ICD-10-CM | POA: Diagnosis not present

## 2023-08-05 DIAGNOSIS — E559 Vitamin D deficiency, unspecified: Secondary | ICD-10-CM | POA: Diagnosis not present

## 2023-08-05 DIAGNOSIS — E785 Hyperlipidemia, unspecified: Secondary | ICD-10-CM | POA: Diagnosis not present

## 2023-08-09 DIAGNOSIS — E785 Hyperlipidemia, unspecified: Secondary | ICD-10-CM | POA: Diagnosis not present

## 2023-08-09 DIAGNOSIS — R7303 Prediabetes: Secondary | ICD-10-CM | POA: Diagnosis not present

## 2023-08-09 DIAGNOSIS — Z8349 Family history of other endocrine, nutritional and metabolic diseases: Secondary | ICD-10-CM | POA: Diagnosis not present

## 2023-08-09 DIAGNOSIS — M13852 Other specified arthritis, left hip: Secondary | ICD-10-CM | POA: Diagnosis not present

## 2023-08-09 DIAGNOSIS — N3941 Urge incontinence: Secondary | ICD-10-CM | POA: Insufficient documentation

## 2023-08-09 DIAGNOSIS — E663 Overweight: Secondary | ICD-10-CM | POA: Diagnosis not present

## 2023-08-09 DIAGNOSIS — Z6829 Body mass index (BMI) 29.0-29.9, adult: Secondary | ICD-10-CM | POA: Diagnosis not present

## 2023-08-09 DIAGNOSIS — Z23 Encounter for immunization: Secondary | ICD-10-CM | POA: Diagnosis not present

## 2023-08-09 DIAGNOSIS — M8580 Other specified disorders of bone density and structure, unspecified site: Secondary | ICD-10-CM | POA: Diagnosis not present

## 2023-08-09 DIAGNOSIS — J189 Pneumonia, unspecified organism: Secondary | ICD-10-CM | POA: Diagnosis not present

## 2023-08-09 DIAGNOSIS — Z0001 Encounter for general adult medical examination with abnormal findings: Secondary | ICD-10-CM | POA: Diagnosis not present

## 2023-08-09 DIAGNOSIS — B09 Unspecified viral infection characterized by skin and mucous membrane lesions: Secondary | ICD-10-CM | POA: Diagnosis not present

## 2023-08-18 DIAGNOSIS — L309 Dermatitis, unspecified: Secondary | ICD-10-CM | POA: Diagnosis not present

## 2023-08-19 DIAGNOSIS — E559 Vitamin D deficiency, unspecified: Secondary | ICD-10-CM | POA: Insufficient documentation

## 2023-09-06 DIAGNOSIS — J4520 Mild intermittent asthma, uncomplicated: Secondary | ICD-10-CM | POA: Diagnosis not present

## 2023-09-06 DIAGNOSIS — E669 Obesity, unspecified: Secondary | ICD-10-CM | POA: Diagnosis not present

## 2023-09-06 DIAGNOSIS — Z008 Encounter for other general examination: Secondary | ICD-10-CM | POA: Diagnosis not present

## 2023-09-06 DIAGNOSIS — Z6830 Body mass index (BMI) 30.0-30.9, adult: Secondary | ICD-10-CM | POA: Diagnosis not present

## 2023-09-06 DIAGNOSIS — Z1211 Encounter for screening for malignant neoplasm of colon: Secondary | ICD-10-CM | POA: Diagnosis not present

## 2023-09-06 DIAGNOSIS — R7303 Prediabetes: Secondary | ICD-10-CM | POA: Diagnosis not present

## 2023-09-13 NOTE — Progress Notes (Deleted)
 Name: Terri [name] DOB: [birth year] MRN: [id number]  History of Present Illness: Terri [name] is a 73 y.o. female who presents today as a new patient at [facility] [place]. All available relevant medical records have been reviewed.  ***She is accompanied by ***.  ***PCP referred her to PFPT - did she go?  Today: She reports chief complaint of ***urinary incontinence.  She {Actions; denies-reports:120008} urge incontinence. She {Actions; denies-reports:120008} stress incontinence with ***cough/***laugh/***sneeze/***heavy lifting /***exercise. She reports the ***SUI / ***UUI is predominant.  She leaks *** times per ***. Wears *** ***pads/ ***diapers per day. She reports urinary incontinence {ACTION; IS/IS [id number]} significantly bothersome.   She reports urinary ***frequency, ***nocturia, and ***urgency. Voiding ***x/day and ***x/night on average.   She {Actions; denies-reports:120008} prior attempted treatment for these symptoms ***including ***  She {Actions; denies-reports:120008} significant caffeine intake (*** caffeinated beverages per day on average).  She {Actions; denies-reports:120008} dysuria, gross hematuria, straining to void, or sensations of incomplete emptying.   Fall Screening: Do you usually have a device to assist in your mobility? {yes/no:20286} ***cane / ***walker / ***wheelchair  Medications: Current Outpatient Medications  Medication Sig Dispense Refill   Acetaminophen (TYLENOL PO) Take by mouth.     Cholecalciferol (VITAMIN D-3) 125 MCG (5000 UT) TABS Take 1 tablet by mouth. occ     nystatin (MYCOSTATIN/NYSTOP) powder Apply topically 2 times daily as directed 30 g 2   No current facility-administered medications for this visit.    Allergies: Allergies  Allergen Reactions   Other Anaphylaxis    alum   Aspirin Swelling   Penicillins Other (See Comments)    Numbness    Past Medical History:  Diagnosis Date   Asthma    hx  no meds   Contact lens/glasses fitting    wears contacts or glasses   HLD (hyperlipidemia) 05/30/2019   Osteoarthritis 05/30/2019   Osteopenia 12/2016   T score -2.3 FRAX 20%   Shingles    Spinal fracture of T11 vertebra [facility])    Past Surgical History:  Procedure Laterality Date   COLONOSCOPY     2008-2009 with Dr. Loreta Ave in [place], no polyps per patient   METATARSAL OSTEOTOMY WITH BUNIONECTOMY Right 09/19/2013   Procedure: RIGHT FOOT CHEVRON OSTEOTOMY AND 2ND TOE RESECTION  ARTHOPLASTY with pin fixation;  Surgeon: Velna Ochs, MD;  Location: [facility];  Service: Orthopedics;  Laterality: Right;   TUBAL LIGATION     WISDOM TOOTH EXTRACTION     Family History  Problem Relation Age of Onset   Heart disease Mother    Lung cancer Mother    Hypertension Mother    Hyperlipidemia Mother    Cancer Mother    Hypertension Father    Stroke Father    Hyperlipidemia Father    Lung cancer Maternal Grandfather    Aneurysm Paternal Grandmother    Lung cancer Paternal Grandfather    Breast cancer Maternal Aunt 52   Lung cancer Maternal Aunt    Colon cancer Maternal Aunt    Breast cancer Paternal Aunt 50   Aneurysm Cousin        4 Maternal 1st cousins   Breast cancer Cousin 40   Colon cancer Cousin    Pancreatic cancer Maternal Uncle    Social History   Socioeconomic History   Marital status: Single    Spouse name: Not on file   Number of children: Not on file   Years of education: Not on file   Highest  education level: Not on file  Occupational History   Not on file  Tobacco Use   Smoking status: Never   Smokeless tobacco: Never  Vaping Use   Vaping status: Never Used  Substance and Sexual Activity   Alcohol use: Not Currently    Comment: Occas.   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   Social Drivers of Corporate investment banker Strain: Not on file  Food Insecurity: Not on file  Transportation Needs: Not on file  Physical Activity: Not on file  Stress: Not on file  Social Connections: Not on file  Intimate Partner Violence: Not on file    SUBJECTIVE  Review of Systems Constitutional: Patient denies any unintentional weight loss or change in strength lntegumentary: Patient denies any rashes or pruritus Cardiovascular: Patient denies chest pain or syncope Respiratory: Patient denies shortness of breath Gastrointestinal: Patient ***denies nausea, vomiting, constipation, or diarrhea Musculoskeletal: Patient denies muscle cramps or weakness Neurologic: Patient denies convulsions or seizures Allergic/Immunologic: Patient denies recent allergic reaction(s) Hematologic/Lymphatic: Patient denies bleeding tendencies Endocrine: Patient denies heat/cold intolerance  GU: As per HPI.  OBJECTIVE There were no vitals filed for this visit. There is no height or weight on file to calculate BMI.  Physical Examination Constitutional: No obvious distress; patient is non-toxic appearing  Cardiovascular: No visible lower extremity edema.  Respiratory: The patient does not have audible wheezing/stridor; respirations do not appear labored  Gastrointestinal: Abdomen non-distended Musculoskeletal: Normal ROM of UEs  Skin: No obvious rashes/open sores  Neurologic: CN 2-12 grossly intact Psychiatric: Answered questions appropriately with normal affect  Hematologic/Lymphatic/Immunologic: No obvious bruises or sites of spontaneous bleeding  UA: ***negative *** WBC/hpf, *** RBC/hpf, *** bacteria ***Urine microscopy: ***negative *** WBC/hpf, *** RBC/hpf, *** bacteria ***with no evidence of UTI ***with no evidence of microscopic  hematuria ***otherwise unremarkable ***glucosuria (secondary to ***Jardiance ***Farxiga use)  PVR: *** ml  ASSESSMENT No diagnosis found.  We discussed the different forms of urinary incontinence, such as stress and urge incontinence, and described how symptoms are consistent with mixed urinary incontinence.  1. For treatment of stress urinary incontinence: The etiology of this condition was explained in detail to include pelvic floor muscle relaxation and detachment of the urethra away from its connection to the pubic bone. Her risk profile was reviewed, including childbirth.  ***A visual demonstration of the pathophysiology of stress urinary incontinence was reviewed.  The management options were reviewed to include: No intervention, observation. Non-surgical options: Pelvic floor muscle rehabilitation Weight loss Incontinence pessary Surgical consultation  ***Options may include a midurethral sling (with synthetic mesh implant or autologous fascial sling), a Burch urethropexy, or transurethral injection of a bulking agent. Detailed discussion was had regarding the potential risks of both procedures, including: mesh exposure, mesh erosion, failure to resolve SUI, ineffectiveness of these procedures for urge UI, dyspareunia, voiding dysfunction and/or retention.  She elected to proceed with ***.  *** Consider possibility of vaginal voiding if pt has recessed urethra and only leaks around 1 tsp at a time. Some urine can collect in vagina and leak upon standing after voiding. If this is suspected, advise them to lean forward while voiding and wipe inside vagina after voiding for 3 months.  If symptoms persist, then move on to UDT for further evaluation.  2. For treatment of OAB with urinary frequency, nocturia, urgency, and urge incontinence: We discussed the symptoms of overactive bladder (OAB), which include urinary urgency, frequency, nocturia, with or without urge incontinence.    While we may not know the exact etiology of OAB, several risk factors can be identified.  - We discussed this patient's neurogenic risk factors for OAB-type symptoms including ***T2DM ***with neuropathy, ***nicotine use, ***spinal stenosis, ***prior stroke, ***dementia.  - Likely exacerbated by ***diuretic use, ***caffeine intake, ***glucosuria (due to ***Jardiance / ***Farxiga use).   We discussed the following management options in detail including potential benefits, risks, and side effects: Behavioral therapy: Modify fluid intake Decreasing bladder irritants (such as caffeine) Urge suppression strategies Bladder retraining / timed voiding Double voiding Medication(s): ***- For anticholinergic medications, we discussed the potential side effects of anticholinergics including dry eyes, dry mouth, constipation, cognitive impairment and urinary retention.  ***- Not a safe candidate for anticholinergic medications due to risk for side effects based on patient's age, comorbidities, and pre-existing dry mouth / dry eyes.  - For beta-3 agonist medication, we discussed the risk for urinary retention and the potential side effect of elevated blood pressure specific to Myrbetriq (which is more likely to occur in individuals with uncontrolled hypertension).  ***- Combo therapy with anticholinergic medication + beta-3 agonist medication 3. For refractory cases: PTNS (posterior tibial nerve stimulation) Sacral neuromodulation trial (Medtronic lnterStim or Axonics implant) Bladder Botox injections  ***In order to further evaluate urinary incontinence, She was instructed to complete a 3-day bladder diary.  ***Discussed recommendation for urodynamic testing, which was described in detail including risks such as bleeding, infection, organ/tissue/nerve damage.  She decided to proceed with *** ***work on behavioral modifications including ***minimizing caffeine intake and working on ***timed voiding /  bladder retraining.   We agreed to plan for follow up in *** weeks / *** months or sooner if needed. Patient verbalized understanding of and agreement with current plan. All questions were answered.  PLAN Advised the following: *** ***. Minimize caffeine intake. ***. Work on timed voiding. ***. Double/ triple voiding. ***. No follow-ups on file.  No orders of the defined types were placed in this encounter.   It has been explained that the patient is to follow regularly with their PCP in addition to all other providers involved in their care and to follow instructions provided by these respective offices. Patient advised to contact urology clinic if any urologic-pertaining questions, concerns, new symptoms or problems arise in the interim period.  There are no Patient Instructions on file for this visit.  Electronically signed by:  Donnita Falls, MSN, FNP-C, CUNP 09/13/2023 8:36 AM

## 2023-09-14 ENCOUNTER — Ambulatory Visit: Payer: No Typology Code available for payment source | Admitting: Urology

## 2023-09-17 ENCOUNTER — Telehealth: Payer: No Typology Code available for payment source

## 2023-09-17 DIAGNOSIS — Z008 Encounter for other general examination: Secondary | ICD-10-CM | POA: Diagnosis not present

## 2023-09-17 DIAGNOSIS — H1031 Unspecified acute conjunctivitis, right eye: Secondary | ICD-10-CM | POA: Diagnosis not present

## 2023-10-01 NOTE — Progress Notes (Signed)
 Name: Terri [name] DOB: 09-Apr-1951 MRN: [id number]  History of Present Illness: Terri [name] is a 73 y.o. female who presents today as a new patient at [facility]. All available relevant medical records have been reviewed.   Today: She reports mildly bothersome urinary urgency and occasional small-volume urge incontinence if she can't get to the bathroom in time. Wears pads when leaving home "just in case". She reports urinary frequency but states it is not bothersome and she correlates it with her fluid intake. She denies nocturia, dysuria, gross hematuria, hesitancy, straining to void, or sensations of incomplete emptying. She denies prior attempted treatment for these symptoms. She drinks 2 cups of coffee each morning and otherwise denies caffeine intake.   Reports rare stress urinary incontinence, which she states is not bothersome.   Reports having a rectocele as diagnosed by her GYN provider. She denies bother due to vaginal bulge.  She denies vaginal pain, bleeding, discharge. Reports some vaginal itching.  She is post menopausal. Does not use topical vaginal estrogen cream.   Fall Screening: Do you usually have a device to assist in your mobility? No   Medications: Current Outpatient Medications  Medication Sig Dispense Refill   Acetaminophen (TYLENOL PO) Take by mouth.     Cholecalciferol (VITAMIN D-3) 125 MCG (5000 UT) TABS Take 1 tablet by mouth. occ     No current facility-administered medications for this visit.    Allergies: Allergies  Allergen Reactions   Other Anaphylaxis    alum   Aspirin Swelling   Penicillins Other (See Comments)    Numbness    Past Medical History:  Diagnosis Date   Asthma    hx no meds   Contact lens/glasses fitting    wears contacts or glasses   HLD (hyperlipidemia) 05/30/2019   Osteoarthritis 05/30/2019   Osteopenia 12/2016   T score -2.3 FRAX 20%   Shingles    Spinal fracture of T11 vertebra [facility])     Past Surgical History:  Procedure Laterality Date   COLONOSCOPY     2008-2009 with Dr. Loreta [name] in [place], no polyps per patient   METATARSAL OSTEOTOMY WITH BUNIONECTOMY Right 09/19/2013   Procedure: RIGHT FOOT CHEVRON OSTEOTOMY AND 2ND TOE RESECTION  ARTHOPLASTY with pin fixation;  Surgeon: Terri Ochs, MD;  Location: [facility];  Service: Orthopedics;  Laterality: Right;   TUBAL LIGATION     WISDOM TOOTH EXTRACTION     Family History  Problem Relation Age of Onset   Heart disease Mother    Lung cancer Mother    Hypertension Mother    Hyperlipidemia Mother    Cancer Mother    Hypertension Father    Stroke Father    Hyperlipidemia Father    Lung cancer Maternal Grandfather    Aneurysm Paternal Grandmother    Lung cancer Paternal Grandfather    Breast cancer Maternal Aunt 52   Lung cancer Maternal Aunt    Colon cancer Maternal Aunt    Breast cancer Paternal Aunt 50   Aneurysm Cousin        4 Maternal 1st cousins   Breast cancer Cousin 32   Colon cancer Cousin    Pancreatic cancer Maternal Uncle    Social History   Socioeconomic History   Marital status: Single    Spouse name: Not on file   Number of children: Not on file   Years of education: Not on file   Highest education level: Not on file  Occupational History   Not on file  Tobacco Use   Smoking status: Never   Smokeless tobacco: Never  Vaping Use   Vaping status: Never Used  Substance and Sexual Activity   Alcohol use: Not Currently    Comment: Occas.   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   Social Drivers of Corporate investment banker Strain: Not on file  Food Insecurity: Not on file   Transportation Needs: Not on file  Physical Activity: Not on file  Stress: Not on file  Social Connections: Not on file  Intimate Partner Violence: Not on file    SUBJECTIVE  Review of Systems Constitutional: Patient denies any unintentional weight loss or change in strength lntegumentary: Patient denies any rashes or pruritus Cardiovascular: Patient denies chest pain or syncope Respiratory: Patient denies shortness of breath Gastrointestinal: Patient denies nausea, vomiting, constipation, or diarrhea Musculoskeletal: Patient denies muscle cramps or weakness Neurologic: Patient denies convulsions or seizures Allergic/Immunologic: Patient denies recent allergic reaction(s) Hematologic/Lymphatic: Patient denies bleeding tendencies Endocrine: Patient denies heat/cold intolerance  GU: As per HPI.  OBJECTIVE Vitals:   10/04/23 0904  BP: 138/73  Pulse: 85  Temp: 97.6 F (36.4 C)   There is no height or weight on file to calculate BMI.  Physical Examination Constitutional: No obvious distress; patient is non-toxic appearing  Cardiovascular: No visible lower extremity edema.  Respiratory: The patient does not have audible wheezing/stridor; respirations do not appear labored  Gastrointestinal: Abdomen non-distended Musculoskeletal: Normal ROM of UEs  Skin: No obvious rashes/open sores  Neurologic: CN 2-12 grossly intact Psychiatric: Answered questions appropriately with normal affect  Hematologic/Lymphatic/Immunologic: No obvious bruises or sites of spontaneous bleeding  UA: 6-10 WBC/hpf, 3-10 RBC/hpf, few bacteria PVR: 0 ml  ASSESSMENT Urinary urgency  Urge incontinence of urine - Plan: Urinalysis, Routine w reflex microscopic, BLADDER SCAN AMB NON-IMAGING  Increased frequency of urination  Stress incontinence, female  Microscopic hematuria - Plan: Urinalysis, Routine w reflex microscopic  Abnormal urinalysis - Plan: Urinalysis, Routine w reflex  microscopic  Rectocele  1. OAB with urinary frequency, urgency, and urge incontinence. - Not significantly bothersome per patient.  - We discussed the symptoms of overactive bladder (OAB), which include urinary urgency, frequency, nocturia, with or without urge incontinence.   We discussed the following management options in detail including potential benefits, risks, and side effects: Behavioral therapy: Modify fluid intake Decreasing bladder irritants (such as caffeine) Urge suppression strategies Bladder retraining / timed voiding Double voiding Medication(s)  She decided to consider behavioral modifications including minimizing caffeine intake and working on timed voiding / bladder retraining.  2. Stress urinary incontinence. Not bothersome per patient.   3. Asymptomatic microscopic hematuria - incidental finding. We discussed possible etiologies including but not limited to: vigorous exercise, sexual activity, stone, trauma, blood thinner use, urinary tract infection, urethral irritation secondary to vaginal atrophy, chronic kidney disease, glomerulonephropathy, malignancy.  We discussed that she has minimal risk factors for malignancy and vaginal atrophy / dryness is suspected cause for her AMH based on her report of vaginal itching with no abnormal discharge. We agreed she will follow up for lab visit in about 2 weeks for repeat urine microscopy for recheck. If positive, will  proceed with CT hematuria and cystoscopy for AMH workup. If negative, will plan to follow up PRN.   Patient verbalized understanding of and agreement with current plan. All questions were answered.  PLAN Advised the following: 1. Return in 2 weeks (on 10/18/2023) for lab visit for urine microscopy.  Orders Placed This Encounter  Procedures   Urinalysis, Routine w reflex microscopic   Urinalysis, Routine w reflex microscopic    Standing Status:   Future    Expected Date:   10/18/2023    Expiration Date:    10/03/2024   BLADDER SCAN AMB NON-IMAGING    It has been explained that the patient is to follow regularly with their PCP in addition to all other providers involved in their care and to follow instructions provided by these respective offices. Patient advised to contact urology clinic if any urologic-pertaining questions, concerns, new symptoms or problems arise in the interim period.  Patient Instructions         Overactive bladder (OAB) overview for patients:  Symptoms may include: urinary urgency ("gotta go" feeling) urinary frequency (voiding >8 times per day) night time urination (nocturia) urge incontinence of urine (UUI)  While we do not know the exact etiology of OAB, several treatment options exist including:  Behavioral therapy: Reducing fluid intake Decreasing bladder stimulants (such as caffeine) and irritants (such as acidic food, spicy foods, alcohol) Urge suppression strategies Bladder retraining via timed voiding  Pelvic floor physical therapy  Medication(s) - can use one or both of the drug classes below. Anticholinergic / antimuscarinic medications:  Mechanism of action: Activate M3 receptors to reduce detrusor stimulation and increase bladder capacity   (parasympathetic nervous system). Effect: Relaxes the bladder to decrease overactivity, increase bladder storage capacity, and increase time between voids. Onset: Slow acting (may take 8-12 weeks to determine efficacy). Medications include: Vesicare (Solifenacin), Ditropan (Oxybutynin), Detrol (Tolterodine), Toviaz (Fesoterodine), Sanctura (Trospium), Urispas (Flavoxate), Enablex (Darifenacin), Bentyl (Dicyclomine), Levsin (Hyoscyamine ). Potential side effects include but are not limited to: Dry eyes, dry mouth, constipation, cognitive impairment, dementia risk with long term use, and urinary retention/ incomplete bladder emptying. Insurance companies generally prefer for patients to try 1-2  anticholinergic / antimuscarinic medications first due to low cost. Some exceptions are made based on patient-specific comorbidities / risk factors. Beta-3 agonist medications: Mechanism of action: Stimulates selective B3 adrenergic receptors to cause smooth muscle bladder relaxation (sympathetic nervous system). Effect: Relaxes the bladder to decrease overactivity, increase bladder storage capacity, and increase time between voids. Onset: Slow acting (may take 8-12 weeks to determine efficacy). Medications include: Myrbetriq (Mirabegron) and Vibegron Leslye Peer). Potential side effects include but are not limited to: urinary retention / incomplete bladder emptying and elevated blood pressure (more likely to occur in individuals with pre-existing uncontrolled hypertension). These medications tend to be more expensive than the anticholinergic / antimuscarinic medications.   For patients with refractory OAB (if the above treatment options have been unsuccessful): Posterior tibial nerve stimulation (PTNS). Small acupuncture-type needle inserted near ankle with electric current to stimulate bladder via posterior tibial nerve pathway. Initially requires 12 weekly in-office treatments lasting 30 minutes each; followed by monthly in-office treatments lasting 30 minutes each for 1 year.  Bladder Botox injections. How it is done: Typically done via in-office cystoscopy; sometimes done in the OR depending on the situation. The bladder is numbed with lidocaine instilled via a catheter. Then the urologist injects Botox into the bladder muscle wall in about 20 locations. Causes local paralysis of the bladder muscle at  the injection sites to reduce bladder muscle overactivity / spasms. The effect lasts for approximately 6 months and cannot be reversed once performed. Risks may included but are not limited to: infection, incomplete bladder emptying/ urinary retention, short term need for self-catheterization or  indwelling catheter, and need for repeat therapy. There is a 5-12% chance of needing to catheterize with Botox - that usually resolves in a few months as the Botox wears off. Typically Botox injections would need to be repeated every 3-12 months since this is not a permanent therapy.  Sacral neuromodulation trial (Medtronic lnterStim or Axonics implant). Sacral neuromodulation is FDA-approved for uncontrolled urinary urgency, urinary frequency, urinary urge incontinence, non-obstructive urinary retention, or fecal incontinence. It is not FDA-approved as a treatment for pain. The goal of this therapy is at least a 50% improvement in symptoms. It is NOT realistic to expect a 100% cure. This is a a 2-step outpatient procedure. After a successful test period, a permanent wire and generator are placed in the OR. We discussed the risk of infection. We reviewed the fact that about 30% of patients fail the test phase and are not candidates for permanent generator placement. During the 1-2 week trial phase, symptoms are documented by the patient to determine response. If patient gets at least a 50% improvement in symptoms, they may then proceed with Step 2. Step 1: Trial lead placement. Per physician discretion, may done one of two ways: Percutaneous nerve evaluation (PNE) in the [facility] urology office. Performed by urologist under local anesthesia (numbing the area with lidocaine) using a spinal needle for placement of test wire, which usually stays in place for 5-7 days to determine therapy response. Test lead placement in OR under anesthesia. Usually stays in place 2 weeks to determine therapy response. > Step 2: Permanent implantation of sacral neuromodulation device, which is performed in the OR.  Sacral neuromodulation implants: All are conditionally MRI safe. Manufacturer: Medtronic Website: [employer].gl  therapy/right-for-you.html Options: lnterStim X: Non-rechargeable. The battery lasts 10 years on average. lnterStim Micro: Rechargeable. The battery lasts 15 years on average and must be charged routinely. Approximately 50% smaller implant than lnterStim X implant.  Manufacturer: Axonics Website: Findrealrelief.axonics.com Options: Non-rechargeable (Axonics F15): The battery lasts 15 years on average. Rechargeable (Axonics R20): The battery lasts 20 years on average and must be charged in office for about 1 hour every 6-10 months on average. Approximately 50% smaller implant than Axonics non-rechargeable implant.  Note: Generally the rechargeable devices are only advised for very small or thin patients who may not have sufficient adipose tissue to comfortably overlay the implanted device.  Suprapubic catheter (SP tube) placement. Only done in severely refractory OAB when all other options have failed or are not a viable treatment choice depending on patient factors. Involves placement of a catheter through the lower abdomen into the bladder to continuously drain the bladder into an external collection bag, which patient can then empty at their convenience every few hours. Done via an outpatient surgical procedure in the OR under anesthesia. Risks may included but are not limited to: surgical site pain, infections, skin irritation / breakdown, chronic bacteriuria, symptomatic UTls. The SP tube must stay in place continuously. This is a reversible procedure however - the insertion site will close if catheter is removed for more than a few hours. The SP tube must be exchanged routinely every 4 weeks to prevent the catheter from becoming clogged with sediment. SP tube exchanges are typically performed at a urology nurse visit or  by a home health nurse.              Electronically signed by:  Donnita Falls, MSN, FNP-C, CUNP 10/04/2023 11:17 AM

## 2023-10-04 ENCOUNTER — Ambulatory Visit: Payer: No Typology Code available for payment source | Admitting: Urology

## 2023-10-04 ENCOUNTER — Encounter: Payer: Self-pay | Admitting: Urology

## 2023-10-04 VITALS — BP 138/73 | HR 85 | Temp 97.6°F

## 2023-10-04 DIAGNOSIS — N3941 Urge incontinence: Secondary | ICD-10-CM | POA: Diagnosis not present

## 2023-10-04 DIAGNOSIS — R350 Frequency of micturition: Secondary | ICD-10-CM

## 2023-10-04 DIAGNOSIS — R3915 Urgency of urination: Secondary | ICD-10-CM | POA: Insufficient documentation

## 2023-10-04 DIAGNOSIS — N816 Rectocele: Secondary | ICD-10-CM | POA: Insufficient documentation

## 2023-10-04 DIAGNOSIS — R3129 Other microscopic hematuria: Secondary | ICD-10-CM | POA: Diagnosis not present

## 2023-10-04 DIAGNOSIS — R8290 Unspecified abnormal findings in urine: Secondary | ICD-10-CM

## 2023-10-04 DIAGNOSIS — N393 Stress incontinence (female) (male): Secondary | ICD-10-CM | POA: Insufficient documentation

## 2023-10-04 LAB — URINALYSIS, ROUTINE W REFLEX MICROSCOPIC
Bilirubin, UA: NEGATIVE
Glucose, UA: NEGATIVE
Ketones, UA: NEGATIVE
Nitrite, UA: NEGATIVE
Protein,UA: NEGATIVE
Specific Gravity, UA: >1.03 — ABNORMAL HIGH (ref 1.005–1.030)
Urobilinogen, Ur: 0.2 mg/dL (ref 0.2–1.0)
pH, UA: 6 (ref 5.0–7.5)

## 2023-10-04 LAB — MICROSCOPIC EXAMINATION

## 2023-10-04 LAB — BLADDER SCAN AMB NON-IMAGING: Scan Result: 0

## 2023-10-04 NOTE — Patient Instructions (Signed)

## 2023-10-04 NOTE — Progress Notes (Signed)
 Bladder Scan completed today.  Patient can void prior to the bladder scan. Bladder scan result: 0  Performed By: [facility] LPN

## 2023-10-20 ENCOUNTER — Other Ambulatory Visit

## 2023-10-20 DIAGNOSIS — R3129 Other microscopic hematuria: Secondary | ICD-10-CM

## 2023-10-20 DIAGNOSIS — R8290 Unspecified abnormal findings in urine: Secondary | ICD-10-CM

## 2023-10-20 LAB — URINALYSIS, ROUTINE W REFLEX MICROSCOPIC
Bilirubin, UA: NEGATIVE
Glucose, UA: NEGATIVE
Ketones, UA: NEGATIVE
Nitrite, UA: NEGATIVE
Protein,UA: NEGATIVE
Specific Gravity, UA: 1.025 (ref 1.005–1.030)
Urobilinogen, Ur: 0.2 mg/dL (ref 0.2–1.0)
pH, UA: 5.5 (ref 5.0–7.5)

## 2023-10-20 LAB — MICROSCOPIC EXAMINATION

## 2023-11-30 ENCOUNTER — Telehealth: Admitting: Family Medicine

## 2023-11-30 DIAGNOSIS — B3731 Acute candidiasis of vulva and vagina: Secondary | ICD-10-CM | POA: Diagnosis not present

## 2023-11-30 MED ORDER — FLUCONAZOLE 150 MG PO TABS: 150.0000 mg | ORAL_TABLET | ORAL | 0 refills | Status: DC

## 2023-11-30 NOTE — Progress Notes (Signed)

## 2023-12-05 ENCOUNTER — Other Ambulatory Visit (INDEPENDENT_AMBULATORY_CARE_PROVIDER_SITE_OTHER): Payer: Self-pay | Admitting: Family Medicine

## 2023-12-05 DIAGNOSIS — B3731 Acute candidiasis of vulva and vagina: Secondary | ICD-10-CM

## 2023-12-06 ENCOUNTER — Encounter: Payer: Self-pay | Admitting: Obstetrics and Gynecology

## 2023-12-06 ENCOUNTER — Ambulatory Visit: Admitting: Obstetrics and Gynecology

## 2023-12-06 VITALS — BP 124/84 | HR 83

## 2023-12-06 DIAGNOSIS — B9689 Other specified bacterial agents as the cause of diseases classified elsewhere: Secondary | ICD-10-CM | POA: Diagnosis not present

## 2023-12-06 DIAGNOSIS — N760 Acute vaginitis: Secondary | ICD-10-CM

## 2023-12-06 DIAGNOSIS — B372 Candidiasis of skin and nail: Secondary | ICD-10-CM | POA: Diagnosis not present

## 2023-12-06 LAB — WET PREP FOR TRICH, YEAST, CLUE

## 2023-12-06 MED ORDER — METRONIDAZOLE 500 MG PO TABS: 500.0000 mg | ORAL_TABLET | Freq: Two times a day (BID) | ORAL | 0 refills | Status: DC

## 2023-12-06 MED ORDER — NYSTATIN 100000 UNIT/GM EX POWD: 1.0000 | Freq: Three times a day (TID) | CUTANEOUS | 2 refills | Status: DC

## 2023-12-06 MED ORDER — FLUCONAZOLE 150 MG PO TABS: ORAL_TABLET | ORAL | 0 refills | Status: DC

## 2023-12-06 NOTE — Progress Notes (Signed)
 GYNECOLOGY  VISIT   HPI: 73 y.o.   Single  Caucasian female   [phone] with No LMP recorded. Patient is postmenopausal.   here for: Possible yeast infection for 2 weeks. Took diflucan  but states it did not clear anything up. No discharge, no odor, no burning with urination, itching in vaginal area.   Used topical creams and did not get relief.  Did an e visit and was treated with Diflucan .  Took 2 tablets.   Some chills.   No recent abx.   A1C was 5.9 and then she was able to lower it to 5.6.   Doing clean eating and eating sour dough bread.    No new exposures.   Wearing leggings.    No change in partner.  Not sexually active.   Seeing urology for microscopic hematuria.  Has follow up in July.   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  4 children.   7 grandchildren.   GYNECOLOGIC HISTORY: No LMP recorded. Patient is postmenopausal. Contraception:  BTL  Menopausal hormone therapy:  n/a Last 2 paps:  10/23/21 neg, 09/26/20 ASCUS HPV neg History of abnormal Pap or positive HPV:  yes Mammogram:  12/24/22 Breast Density Cat B, BIRADS Cat 1 neg         OB History     Gravida  4   Para  4   Term  4   Preterm      AB      Living  4      SAB      IAB      Ectopic      Multiple      Live Births                 Patient Active Problem List   Diagnosis Date Noted   Rectocele 10/04/2023   Stress incontinence, female 10/04/2023   Urinary urgency 10/04/2023   Vitamin D  deficiency 08/19/2023   Urge incontinence of urine 08/09/2023   Prediabetes 01/06/2023   LUQ pain 12/23/2022   Insomnia 12/07/2022   Herpes labialis 05/25/2022   Increased frequency of urination 07/04/2021   Arthritis of left hip 06/17/2021   HLD (hyperlipidemia) 05/30/2019   Osteopenia 05/30/2019   Osteoarthritis 05/30/2019   Asthma 05/30/2019    Past Medical History:  Diagnosis Date   Asthma    hx no meds   Contact lens/glasses  fitting    wears contacts or glasses   HLD (hyperlipidemia) 05/30/2019   Osteoarthritis 05/30/2019   Osteopenia 12/2016   T score -2.3 FRAX 20%   Shingles    Spinal fracture of T11 vertebra [facility])     Past Surgical History:  Procedure Laterality Date   COLONOSCOPY     2008-2009 with Dr. Tova Fresh in [place], no polyps per patient   METATARSAL OSTEOTOMY WITH BUNIONECTOMY Right 09/19/2013   Procedure: RIGHT FOOT CHEVRON OSTEOTOMY AND 2ND TOE RESECTION  ARTHOPLASTY with pin fixation;  Surgeon: Alphonzo Ask, MD;  Location: [facility];  Service: Orthopedics;  Laterality: Right;   TUBAL LIGATION     WISDOM TOOTH EXTRACTION      Current Outpatient Medications  Medication Sig Dispense Refill   Acetaminophen  (TYLENOL  PO) Take by mouth.     Cholecalciferol (VITAMIN D -3) 125 MCG (5000 UT) TABS Take 1 tablet by mouth. occ     valACYclovir (VALTREX) 1000 MG tablet TAKE 1 TABLET BY MOUTH EVERY 8 HOURS FOR 5  DAYS     No current facility-administered medications for this visit.     ALLERGIES: Other, Aspirin, and Penicillins  Family History  Problem Relation Age of Onset   Heart disease Mother    Lung cancer Mother    Hypertension Mother    Hyperlipidemia Mother    Cancer Mother    Hypertension Father    Stroke Father    Hyperlipidemia Father    Lung cancer Maternal Grandfather    Aneurysm Paternal Grandmother    Lung cancer Paternal Grandfather    Breast cancer Maternal Aunt 52   Lung cancer Maternal Aunt    Colon cancer Maternal Aunt    Breast cancer Paternal Aunt 50   Aneurysm Cousin        4 Maternal 1st cousins   Breast cancer Cousin 59   Colon cancer Cousin    Pancreatic cancer Maternal Uncle     Social History   Socioeconomic History   Marital status: Single    Spouse name: Not on file   Number of children: Not on file   Years of education: Not on file   Highest education level: Not on file  Occupational History   Not on file  Tobacco Use    Smoking status: Never   Smokeless tobacco: Never  Vaping Use   Vaping status: Never Used  Substance and Sexual Activity   Alcohol use: Not Currently    Comment: Occas.   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   Social Drivers of Corporate investment banker Strain: Not on file  Food Insecurity: Not on file  Transportation Needs: Not on file  Physical Activity: Not on file  Stress: Not on file  Social Connections: Not on file  Intimate Partner Violence: Not on file    Review of Systems  All other systems reviewed and are negative.   PHYSICAL EXAMINATION:   BP 124/84 (BP Location: Right Arm, Patient Position: Sitting, Cuff Size: Normal)   Pulse 83   SpO2 97%     General appearance: alert, cooperative and appears stated age   Skin: erythema under pannus.  Petechia of the right thigh.     Pelvic: External genitalia:  rash on the vulva, erythema of flexural skin folds.               Urethra:  normal appearing urethra with no masses, tenderness or lesions              Bartholins and Skenes: normal                 Vagina: normal appearing vagina with normal color and discharge, no lesions              Cervix: no lesions                Bimanual Exam:  Uterus:  normal size, contour, position, consistency, mobility, non-tender              Adnexa: no mass, fullness, tenderness    Chaperone was present for exam:   Cottie Diss, CMA  ASSESSMENT:  Bacterial vaginosis.  Candida of flexural skin.  Prediabetic.   PLAN:  Wet prep:  clue cells  noted, positive odor test, negative yeast, negative trichomonas.  Flagyl 500 mg po bid x 7 days.  Diflucan  150 mg po q 3 days x 3 doses.  Nystatin   powder tid x 1 week prn.  Use loose fitting clothing.  FU prn.

## 2023-12-08 ENCOUNTER — Other Ambulatory Visit: Payer: Self-pay | Admitting: Family Medicine

## 2023-12-08 ENCOUNTER — Other Ambulatory Visit: Payer: Self-pay | Admitting: Obstetrics and Gynecology

## 2023-12-08 DIAGNOSIS — Z0000 Encounter for general adult medical examination without abnormal findings: Secondary | ICD-10-CM

## 2023-12-24 ENCOUNTER — Encounter

## 2023-12-24 DIAGNOSIS — Z1231 Encounter for screening mammogram for malignant neoplasm of breast: Secondary | ICD-10-CM

## 2023-12-28 ENCOUNTER — Ambulatory Visit

## 2024-01-06 ENCOUNTER — Other Ambulatory Visit: Payer: Self-pay | Admitting: Medical Genetics

## 2024-01-10 ENCOUNTER — Other Ambulatory Visit (HOSPITAL_COMMUNITY): Payer: Self-pay

## 2024-01-13 ENCOUNTER — Ambulatory Visit

## 2024-01-20 ENCOUNTER — Ambulatory Visit
Admission: RE | Admit: 2024-01-20 | Discharge: 2024-01-20 | Disposition: A | Discharge status: Home / Self Care | Source: Ambulatory Visit | Attending: Obstetrics and Gynecology | Admitting: Obstetrics and Gynecology

## 2024-01-20 DIAGNOSIS — Z0000 Encounter for general adult medical examination without abnormal findings: Secondary | ICD-10-CM

## 2024-01-20 DIAGNOSIS — Z1231 Encounter for screening mammogram for malignant neoplasm of breast: Secondary | ICD-10-CM | POA: Diagnosis not present

## 2024-01-21 ENCOUNTER — Ambulatory Visit: Payer: Self-pay | Admitting: Obstetrics and Gynecology

## 2024-01-21 DIAGNOSIS — M8580 Other specified disorders of bone density and structure, unspecified site: Secondary | ICD-10-CM

## 2024-02-01 DIAGNOSIS — E785 Hyperlipidemia, unspecified: Secondary | ICD-10-CM | POA: Diagnosis not present

## 2024-02-01 DIAGNOSIS — E559 Vitamin D deficiency, unspecified: Secondary | ICD-10-CM | POA: Diagnosis not present

## 2024-02-01 DIAGNOSIS — R7303 Prediabetes: Secondary | ICD-10-CM | POA: Diagnosis not present

## 2024-02-07 DIAGNOSIS — M8580 Other specified disorders of bone density and structure, unspecified site: Secondary | ICD-10-CM | POA: Diagnosis not present

## 2024-02-07 DIAGNOSIS — E663 Overweight: Secondary | ICD-10-CM | POA: Diagnosis not present

## 2024-02-07 DIAGNOSIS — Z8349 Family history of other endocrine, nutritional and metabolic diseases: Secondary | ICD-10-CM | POA: Diagnosis not present

## 2024-02-07 DIAGNOSIS — R7303 Prediabetes: Secondary | ICD-10-CM | POA: Diagnosis not present

## 2024-02-07 DIAGNOSIS — Z79899 Other long term (current) drug therapy: Secondary | ICD-10-CM | POA: Diagnosis not present

## 2024-02-07 DIAGNOSIS — M13852 Other specified arthritis, left hip: Secondary | ICD-10-CM | POA: Diagnosis not present

## 2024-02-07 DIAGNOSIS — E785 Hyperlipidemia, unspecified: Secondary | ICD-10-CM | POA: Diagnosis not present

## 2024-02-07 DIAGNOSIS — B09 Unspecified viral infection characterized by skin and mucous membrane lesions: Secondary | ICD-10-CM | POA: Diagnosis not present

## 2024-02-07 DIAGNOSIS — N3941 Urge incontinence: Secondary | ICD-10-CM | POA: Diagnosis not present

## 2024-03-17 DIAGNOSIS — N3090 Cystitis, unspecified without hematuria: Secondary | ICD-10-CM | POA: Diagnosis not present

## 2024-03-17 DIAGNOSIS — Z008 Encounter for other general examination: Secondary | ICD-10-CM | POA: Diagnosis not present

## 2024-05-08 ENCOUNTER — Encounter: Payer: Self-pay | Admitting: Obstetrics and Gynecology

## 2024-05-08 ENCOUNTER — Ambulatory Visit (INDEPENDENT_AMBULATORY_CARE_PROVIDER_SITE_OTHER): Admitting: Obstetrics and Gynecology

## 2024-05-08 VITALS — BP 122/80 | HR 71 | Ht 60.43 in | Wt 163.0 lb

## 2024-05-08 DIAGNOSIS — Z9189 Other specified personal risk factors, not elsewhere classified: Secondary | ICD-10-CM | POA: Diagnosis not present

## 2024-05-08 DIAGNOSIS — B009 Herpesviral infection, unspecified: Secondary | ICD-10-CM

## 2024-05-08 DIAGNOSIS — M8580 Other specified disorders of bone density and structure, unspecified site: Secondary | ICD-10-CM | POA: Diagnosis not present

## 2024-05-08 DIAGNOSIS — N952 Postmenopausal atrophic vaginitis: Secondary | ICD-10-CM

## 2024-05-08 DIAGNOSIS — E2839 Other primary ovarian failure: Secondary | ICD-10-CM | POA: Diagnosis not present

## 2024-05-08 DIAGNOSIS — D229 Melanocytic nevi, unspecified: Secondary | ICD-10-CM

## 2024-05-08 DIAGNOSIS — Z01419 Encounter for gynecological examination (general) (routine) without abnormal findings: Secondary | ICD-10-CM

## 2024-05-08 DIAGNOSIS — Z1211 Encounter for screening for malignant neoplasm of colon: Secondary | ICD-10-CM

## 2024-05-08 MED ORDER — PROGESTERONE MICRONIZED 100 MG PO CAPS: 100.0000 mg | ORAL_CAPSULE | Freq: Every day | ORAL | 6 refills | Status: AC

## 2024-05-08 MED ORDER — ESTRADIOL 0.025 MG/24HR TD PTTW: 1.0000 | MEDICATED_PATCH | TRANSDERMAL | 12 refills | Status: DC

## 2024-05-08 MED ORDER — ESTRADIOL 0.025 MG/24HR TD PTTW: 1.0000 | MEDICATED_PATCH | TRANSDERMAL | 12 refills | Status: AC

## 2024-05-08 MED ORDER — PROGESTERONE MICRONIZED 100 MG PO CAPS
100.0000 mg | ORAL_CAPSULE | Freq: Every day | ORAL | 11 refills | Status: DC
Start: 2024-05-08 — End: 2024-05-08

## 2024-05-08 MED ORDER — INTRAROSA 6.5 MG VA INST: 1.0000 | VAGINAL_INSERT | Freq: Every evening | VAGINAL | 12 refills | Status: AC | PRN

## 2024-05-08 NOTE — Progress Notes (Signed)
 73 y.o. y.o. female here for annual exam. No LMP recorded. Patient is postmenopausal.     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  4 children.   7 grandchildren.   GYNECOLOGIC HISTORY: Started on hrt in 2021 and reports it helped her sleep and would like to return to it. Her PCP was prescribing it and that provider has retired. She always did well on it. No LMP recorded. Patient is postmenopausal. Contraception:  BTL  Menopausal hormone therapy:  n/a Last 2 paps:  10/23/21 neg, 09/26/20 ASCUS HPV neg remote abnormal.  History of abnormal Pap or positive HPV:  yes Mammogram:  12/24/22 Breast Density Cat B, BIRADS Cat 1 neg  Dxa: osteopenia 2018 -2.3, 2022 -1.9 frax 19, 2.5% No meds, no fractures. Repeat at [facility] Pen Colonoscopy: send guaiac test in yearly. Willing to do cologuard Derm: referral for mole check placed.  Several atypical moles seen Labs: with PCP  Body mass index is 31.38 kg/m.      No data to display          Blood pressure 122/80, pulse 71, height 5' 0.43 (1.535 m), weight 163 lb (73.9 kg), SpO2 98%.     Component Value Date/Time   DIAGPAP  10/23/2021 1655    - Negative for intraepithelial lesion or malignancy (NILM)   ADEQPAP  10/23/2021 1655    Satisfactory for evaluation. The presence or absence of an   ADEQPAP  10/23/2021 1655    endocervical/transformation zone component cannot be determined because   ADEQPAP of atrophy. 10/23/2021 1655    GYN HISTORY:    Component Value Date/Time   DIAGPAP  10/23/2021 1655    - Negative for intraepithelial lesion or malignancy (NILM)   ADEQPAP  10/23/2021 1655    Satisfactory for evaluation. The presence or absence of an   ADEQPAP  10/23/2021 1655    endocervical/transformation zone component cannot be determined because   ADEQPAP of atrophy. 10/23/2021 1655    OB History  Gravida Para Term Preterm AB Living  4 4 4   4   SAB IAB Ectopic Multiple Live Births           # Outcome Date GA Lbr Len/2nd Weight Sex Type Anes PTL Lv  4 Term           3 Term           2 Term           1 Term             Past Medical History:  Diagnosis Date   Asthma    hx no meds   Contact lens/glasses fitting    wears contacts or glasses   HLD (hyperlipidemia) 05/30/2019   Osteoarthritis 05/30/2019   Osteopenia 12/2016   T score -2.3 FRAX 20%   Shingles    Spinal fracture of T11 vertebra [facility])     Past Surgical History:  Procedure Laterality Date   COLONOSCOPY     2008-2009 with Dr. Kristie in [place], no polyps per patient   METATARSAL OSTEOTOMY WITH BUNIONECTOMY Right 09/19/2013   Procedure: RIGHT FOOT CHEVRON OSTEOTOMY AND 2ND TOE RESECTION  ARTHOPLASTY with pin fixation;  Surgeon: Maude KANDICE Herald, MD;  Location: [facility];  Service: Orthopedics;  Laterality: Right;   TUBAL LIGATION     WISDOM TOOTH EXTRACTION      Current Outpatient Medications on File Prior to Visit  Medication Sig  Dispense Refill   Acetaminophen  (TYLENOL  PO) Take by mouth.     Cholecalciferol (VITAMIN D -3) 125 MCG (5000 UT) TABS Take 1 tablet by mouth. occ     nystatin  (MYCOSTATIN /NYSTOP ) powder Apply 1 Application topically 3 (three) times daily. Apply to affected area for up to 7 days as needed. 30 g 2   No current facility-administered medications on file prior to visit.    Social History   Socioeconomic History   Marital status: Single    Spouse name: Not on file   Number of children: Not on file   Years of education: Not on file   Highest education level: Not on file  Occupational History   Not on file  Tobacco Use   Smoking status: Never   Smokeless tobacco: Never  Vaping Use   Vaping status: Never Used  Substance and Sexual Activity   Alcohol use: Not Currently    Comment: Occas.   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   Social Drivers of Corporate investment banker Strain: Not on file  Food Insecurity: Not on file  Transportation Needs: Not on file  Physical Activity: Not on file  Stress: Not on file  Social Connections: Not on file  Intimate Partner Violence: Not on file    Family History  Problem Relation Age of Onset   Heart disease Mother    Lung cancer Mother    Hypertension Mother    Hyperlipidemia Mother    Cancer Mother    Hypertension Father    Stroke Father    Hyperlipidemia Father    Lung cancer Maternal Grandfather    Aneurysm Paternal Grandmother    Lung cancer Paternal Grandfather    Breast cancer Maternal Aunt 52   Lung cancer Maternal Aunt    Colon cancer Maternal Aunt    Breast cancer Paternal Aunt 50   Aneurysm Cousin        4 Maternal 1st cousins   Breast cancer Cousin 48   Colon cancer Cousin    Pancreatic cancer Maternal Uncle      Allergies  Allergen Reactions   Other Anaphylaxis    alum   Aspirin Swelling   Penicillins Other (See Comments)    Numbness      Patient's last menstrual period was No LMP recorded. Patient is postmenopausal..            Review of Systems Alls systems reviewed and are negative.     Physical Exam Constitutional:      Appearance: Normal appearance.  Genitourinary:     Vulva and urethral meatus normal.     No lesions in the vagina.     Right Labia: No rash, lesions or skin changes.    Left Labia: No lesions, skin changes or rash.    No vaginal discharge or tenderness.     Posterior vaginal prolapse present.    Moderate vaginal atrophy present.     Right Adnexa: not tender, not palpable and no mass present.    Left Adnexa: not tender, not palpable and no mass present.    No cervical motion tenderness or discharge.     Uterus is  not enlarged, tender or irregular.  Breasts:    Right: Normal.     Left: Normal.  HENT:     Head: Normocephalic.  Neck:     Thyroid : No thyroid  mass, thyromegaly or thyroid  tenderness.  Cardiovascular:     Rate and Rhythm: Normal rate and regular rhythm.     Heart sounds: Normal heart sounds, S1 normal and S2 normal.  Pulmonary:     Effort: Pulmonary effort is normal.     Breath sounds: Normal breath sounds and air entry.  Abdominal:     General: There is no distension.     Palpations: Abdomen is soft. There is no mass.     Tenderness: There is no abdominal tenderness. There is no guarding or rebound.  Musculoskeletal:        General: Normal range of motion.     Cervical back: Full passive range of motion without pain, normal range of motion and neck supple. No tenderness.     Right lower leg: No edema.     Left lower leg: No edema.  Neurological:     Mental Status: She is alert.  Skin:    General: Skin is warm.  Psychiatric:        Mood and Affect: Mood normal.        Behavior: Behavior normal.        Thought Content: Thought content normal.  Vitals and nursing note reviewed. Exam conducted with a chaperone present.       A:         Well Woman GYN exam                             P:        Pap smear not indicated Would like to do next year Encouraged annual mammogram screening Colon cancer screening referral placed today DXA ordered today Labs and immunizations to do with PMD Discussed breast self exams Encouraged healthy lifestyle practices Encouraged Vit D and Calcium  Counseled on risks with HRT such as risk of CAD, stroke, DVT with late use. Discussed lower risk of colon cancer and improved bone density. To begin low dose estrogen patch and prometrium  at bedtime.  Counseled on risks of endometrial cancer with using estrogen alone and she voiced understanding. Patient with known rectocele and atrophic vaginitis: to begin instrosa at bedtime as well, if financially  able to.  Patient is not bothered by it at this point.  No follow-ups on file.  Terri [name]

## 2024-05-10 ENCOUNTER — Telehealth: Payer: Self-pay

## 2024-05-10 NOTE — Telephone Encounter (Signed)
 [place] was submitted to covermymeds.   Xzb:[id number]  DX: N94.1 N94.89   Pt is aware via a mychart message   Faxed clininals via epic to [phone]  Case # [id number]

## 2024-05-15 ENCOUNTER — Encounter: Payer: Self-pay | Admitting: Obstetrics and Gynecology

## 2024-05-15 ENCOUNTER — Ambulatory Visit: Payer: Self-pay | Admitting: Obstetrics and Gynecology

## 2024-05-15 ENCOUNTER — Other Ambulatory Visit: Payer: Self-pay | Admitting: Obstetrics and Gynecology

## 2024-05-15 ENCOUNTER — Ambulatory Visit (HOSPITAL_COMMUNITY)
Admission: RE | Admit: 2024-05-15 | Discharge: 2024-05-15 | Disposition: A | Discharge status: Home / Self Care | Source: Ambulatory Visit | Attending: Obstetrics and Gynecology | Admitting: Obstetrics and Gynecology

## 2024-05-15 DIAGNOSIS — M8580 Other specified disorders of bone density and structure, unspecified site: Secondary | ICD-10-CM | POA: Diagnosis not present

## 2024-05-15 DIAGNOSIS — E2839 Other primary ovarian failure: Secondary | ICD-10-CM | POA: Diagnosis not present

## 2024-05-15 DIAGNOSIS — Z01419 Encounter for gynecological examination (general) (routine) without abnormal findings: Secondary | ICD-10-CM | POA: Diagnosis not present

## 2024-05-15 MED ORDER — JUBBONTI 60 MG/ML ~~LOC~~ SOSY: 60.0000 mg | PREFILLED_SYRINGE | SUBCUTANEOUS | 6 refills | Status: AC

## 2024-05-15 MED ORDER — DENOSUMAB 60 MG/ML ~~LOC~~ SOSY: 60.0000 mg | PREFILLED_SYRINGE | Freq: Once | SUBCUTANEOUS | 0 refills | Status: AC

## 2024-05-15 MED ORDER — EVENITY 105 MG/1.17ML ~~LOC~~ SOSY
210.0000 mg | PREFILLED_SYRINGE | Freq: Once | SUBCUTANEOUS | 0 refills | Status: AC
Start: 2024-05-15 — End: 2024-05-15

## 2024-05-17 NOTE — Telephone Encounter (Signed)
 This PA has been denied by [employer].   Please advise on the next steps  Also placed a redetermination form in your black bin outside your office door.   Pt is also aware

## 2024-06-02 ENCOUNTER — Other Ambulatory Visit: Payer: Self-pay | Admitting: Medical Genetics

## 2024-06-02 DIAGNOSIS — Z006 Encounter for examination for normal comparison and control in clinical research program: Secondary | ICD-10-CM

## 2024-06-08 ENCOUNTER — Telehealth: Payer: Self-pay

## 2024-06-08 LAB — COLOGUARD: COLOGUARD: NEGATIVE

## 2024-06-08 NOTE — Telephone Encounter (Signed)
-----   Message from Annabella LITTIE Stagger sent at 06/08/2024  1:07 PM EST -----  ----- Message ----- From: Glennon Almarie POUR, MD Sent: 06/08/2024  12:05 PM EST To: Damien ONEIDA Mau, RN; Annabella LITTIE Stagger, CPhT  Never heard back for any medication. Tyring for evenity, prolia or jubbonti  Has osteoporosis based on osteopenia with a positive frax  Thank you Damien Dr. Glennon

## 2024-06-08 NOTE — Telephone Encounter (Deleted)
 Prolia  VOB initiated via [email or web address]  Next Prolia  inj DUE: NEW START

## 2024-06-08 NOTE — Telephone Encounter (Signed)
 Prolia  VOB initiated via [email or web address]  Next Prolia  inj DUE: NEW START

## 2024-06-13 ENCOUNTER — Other Ambulatory Visit (HOSPITAL_COMMUNITY): Payer: Self-pay

## 2024-06-13 NOTE — Telephone Encounter (Signed)
 Terri [name]

## 2024-06-13 NOTE — Telephone Encounter (Signed)
 Buy/Bill (Office supplied medication)  Out-of-pocket cost due at time of clinic visit: $332  Number of injection/visits approved: 2  Primary: [employer] Co-insurance: 20% Admin fee co-insurance: 0%  Secondary: --- Co-insurance:  Admin fee co-insurance:   Medical Benefit Details: Date Benefits were checked: 06/13/24 Deductible: NO/ Coinsurance: 20%/ Admin Fee: 0%  Prior Auth: APPROVED PA# [id number] Expiration Date: 06/14/24-06/14/25  # of doses approved: 2 -----------------------------------------------------------------------  Patient NOT eligible for Copay Card. Copay Card can make patient's cost as little as $25. Link to apply: [email or web address]://[email or web address]  ** This summary of benefits is an estimation of the patient's out-of-pocket cost. Exact cost may very based on individual plan coverage.

## 2024-06-26 ENCOUNTER — Other Ambulatory Visit: Payer: Self-pay | Admitting: *Deleted

## 2024-06-26 NOTE — Telephone Encounter (Signed)
 Call to patient. Advised of benefits as seen below for Prolia  injection. Patient states she would like to think about this and return call in January after the holidays. Not affordable at this time. RN advised to return call to office either way if she decides she would like to proceed or not so that Dr. Glennon could make recommendations for alternative medications. Patient agreeable and states she will return call in January.   Routing to provider as RICK.

## 2024-06-26 NOTE — Telephone Encounter (Signed)
 See referral

## 2024-06-30 LAB — GENECONNECT MOLECULAR SCREEN: Genetic Analysis Overall Interpretation: NEGATIVE

## 2024-07-10 ENCOUNTER — Other Ambulatory Visit (HOSPITAL_COMMUNITY): Payer: Self-pay

## 2024-07-10 ENCOUNTER — Other Ambulatory Visit: Payer: Self-pay | Admitting: Obstetrics and Gynecology

## 2024-07-10 ENCOUNTER — Encounter: Payer: Self-pay | Admitting: Obstetrics and Gynecology

## 2024-07-10 MED ORDER — NYSTATIN 100000 UNIT/GM EX POWD
1.0000 | Freq: Two times a day (BID) | CUTANEOUS | 2 refills | Status: AC
  Filled 2024-07-10: qty 30, 15d supply, fill #0

## 2024-07-10 MED ORDER — KETOCONAZOLE & MICONAZOLE 2 & 2 % EX THPK
1.0000 | PACK | Freq: Two times a day (BID) | CUTANEOUS | 3 refills | Status: AC
  Filled 2024-07-10: qty 1, fill #0

## 2024-07-20 ENCOUNTER — Other Ambulatory Visit (HOSPITAL_COMMUNITY): Payer: Self-pay

## 2024-08-07 ENCOUNTER — Ambulatory Visit: Admitting: Dermatology

## 2024-08-22 ENCOUNTER — Other Ambulatory Visit (HOSPITAL_COMMUNITY): Payer: Self-pay | Admitting: Nurse Practitioner

## 2024-08-22 DIAGNOSIS — E785 Hyperlipidemia, unspecified: Secondary | ICD-10-CM

## 2024-08-31 ENCOUNTER — Ambulatory Visit (HOSPITAL_COMMUNITY)
Admission: RE | Admit: 2024-08-31 | Discharge: 2024-08-31 | Disposition: A | Discharge status: Home / Self Care | Payer: Self-pay | Source: Ambulatory Visit | Attending: Nurse Practitioner | Admitting: Nurse Practitioner

## 2024-08-31 DIAGNOSIS — E785 Hyperlipidemia, unspecified: Secondary | ICD-10-CM | POA: Insufficient documentation
